# Patient Record
Sex: Male | Born: 1970
Health system: Southern US, Community
[De-identification: ages and names within clinical notes are randomized; demographics above are authoritative.]

## PROBLEM LIST (undated history)

## (undated) DIAGNOSIS — R0789 Other chest pain: Secondary | ICD-10-CM

## (undated) DIAGNOSIS — K219 Gastro-esophageal reflux disease without esophagitis: Secondary | ICD-10-CM

## (undated) DIAGNOSIS — I452 Bifascicular block: Secondary | ICD-10-CM

## (undated) DIAGNOSIS — K4190 Unilateral femoral hernia, without obstruction or gangrene, not specified as recurrent: Secondary | ICD-10-CM

## (undated) HISTORY — DX: Unilateral femoral hernia, without obstruction or gangrene, not specified as recurrent: K41.90

## (undated) HISTORY — DX: Bifascicular block: I45.2

## (undated) HISTORY — DX: Gastro-esophageal reflux disease without esophagitis: K21.9

## (undated) HISTORY — PX: OTHER SURGICAL HISTORY: SHX169

## (undated) HISTORY — DX: Other chest pain: R07.89

---

## 2008-02-03 ENCOUNTER — Ambulatory Visit (HOSPITAL_COMMUNITY): Admission: RE | Admit: 2008-02-03 | Discharge: 2008-02-03 | Payer: Self-pay | Admitting: Chiropractic Medicine

## 2008-12-26 ENCOUNTER — Emergency Department (HOSPITAL_COMMUNITY): Admission: EM | Admit: 2008-12-26 | Discharge: 2008-12-26 | Payer: Self-pay | Admitting: Emergency Medicine

## 2009-06-17 ENCOUNTER — Ambulatory Visit: Payer: Self-pay | Admitting: Family Medicine

## 2009-06-17 LAB — CONVERTED CEMR LAB
Hemoglobin: 13.2 g/dL
Microalbumin U total vol: 80 mg/L

## 2011-02-06 ENCOUNTER — Ambulatory Visit (INDEPENDENT_AMBULATORY_CARE_PROVIDER_SITE_OTHER): Payer: BC Managed Care – PPO | Admitting: Family Medicine

## 2011-02-06 ENCOUNTER — Encounter: Payer: Self-pay | Admitting: Family Medicine

## 2011-02-06 DIAGNOSIS — R0789 Other chest pain: Secondary | ICD-10-CM | POA: Insufficient documentation

## 2011-02-06 DIAGNOSIS — I4460 Unspecified fascicular block: Secondary | ICD-10-CM | POA: Insufficient documentation

## 2011-02-14 NOTE — Assessment & Plan Note (Signed)
Summary: Atypical CP   Vital Signs:  Patient profile:   40 year old male Height:      71.4 inches Weight:      194.25 pounds BMI:     26.89 O2 Sat:      100 % on Room air Pulse rate:   80 / minute BP sitting:   124 / 77  (right arm) Cuff size:   large  Vitals Entered By: Francee Piccolo CMA Duncan Dull) (February 06, 2011 10:44 AM)  O2 Flow:  Room air CC: globus sensation when lying down, thought he had heartburn, took Prevacid with some relief...SP Is Patient Diabetic? No   Primary Care Provider:  Nani Gasser MD  CC:  globus sensation when lying down, thought he had heartburn, and took Prevacid with some relief...SP.  History of Present Illness: globus sensation when lying down, thought he had heartburn, took Prevacid with some relief...SP.  Says it is not a pain but feels like when he lays down feels like tsomthing wants to come up in his throat. Feels annoying.  No sharp pain. No blood in the bowels or urine.  Does have a cold right now but says never had this with a cold before.  Better when stands up.   Took Prevacid for 2 days.  Does have a nighttime snack before dinner.  No brash.  No nausea or vomiting. NO SOB.    Last time I saw him was in 2010 and he never went for labs.    Current Medications (verified): 1)  Prevacid 15 Mg Cpdr (Lansoprazole) .... Take 1 Tablet By Mouth Once A Day  Allergies (verified): No Known Drug Allergies  Physical Exam  General:  Well-developed,well-nourished,in no acute distress; alert,appropriate and cooperative throughout examination Head:  Normocephalic and atraumatic without obvious abnormalities. No apparent alopecia or balding. Neck:  No deformities, masses, or tenderness noted. Lungs:  Normal respiratory effort, chest expands symmetrically. Lungs are clear to auscultation, no crackles or wheezes. Heart:  Normal rate and regular rhythm. S1 and S2 normal without gallop, murmur, click, rub or other extra sounds. Abdomen:  Bowel  sounds positive,abdomen soft and non-tender without masses, organomegaly or hernias noted. Skin:  no rashes.   Cervical Nodes:  No lymphadenopathy noted Psych:  Cognition and judgment appear intact. Alert and cooperative with normal attention span and concentration. No apparent delusions, illusions, hallucinations   Impression & Recommendations:  Problem # 1:  CHEST PAIN, ATYPICAL (ICD-786.59)  Likely GERD based on his sxs.  Not likely cardiac but did get EKG today EKG shows rate of 65 bpm, no acute changes but does have a incomplete RBBB. Likely normal variant since he is young. Will refer to Cardiology for further evaluation since he is very concerned about this.   Continue prevacid. If not resolving in 2 weeks then call the office. Also reviewd reflux hygient and recommend not eating 3 hours prior to bedtime.  Over due for screening labs. Never went when had CPE in 2010.  Orders: T-Comprehensive Metabolic Panel [phone]) T-Lipid Profile [phone]) T-TSH [phone]) EKG w/ Interpretation (93000)  Complete Medication List: 1)  Prevacid 15 Mg Cpdr (Lansoprazole) .... Take 1 tablet by mouth once a day  Other Orders: Cardiology Referral (Cardiology)  Patient Instructions: 1)  Continue your prevacid. About 20 min before dinner.  If not better in 1-2 weeks then please call the oiffice. It if it helping then continue for one month and then taper to every other day. 2)  Avoid eating 3  hours before bedtime if can. 3)  Avoid greasy, spicey, acidic foods. Avoid carbonated beverages.  4)  Can raise the head of the bed if needed.    Orders Added: 1)  Est. Patient Level IV [16109] 2)  T-Comprehensive Metabolic Panel [[id number]] 3)  T-Lipid Profile [[id number]] 4)  T-TSH [[id number]] 5)  Cardiology Referral [Cardiology] 6)  EKG w/ Interpretation [93000]

## 2011-02-21 ENCOUNTER — Encounter: Payer: Self-pay | Admitting: Cardiology

## 2011-02-22 ENCOUNTER — Ambulatory Visit: Payer: BC Managed Care – PPO | Admitting: Cardiology

## 2011-02-22 ENCOUNTER — Ambulatory Visit (INDEPENDENT_AMBULATORY_CARE_PROVIDER_SITE_OTHER): Payer: BC Managed Care – PPO | Admitting: Cardiology

## 2011-02-22 ENCOUNTER — Encounter: Payer: Self-pay | Admitting: Cardiology

## 2011-02-22 DIAGNOSIS — K219 Gastro-esophageal reflux disease without esophagitis: Secondary | ICD-10-CM

## 2011-02-22 DIAGNOSIS — R0789 Other chest pain: Secondary | ICD-10-CM

## 2011-02-22 NOTE — Assessment & Plan Note (Signed)
Continue present medications. Management per primary care. 

## 2011-02-22 NOTE — Assessment & Plan Note (Signed)
Symptoms are most consistent with gastroesophageal reflux disease. He does not have cardiac risk factors and his electrocardiogram showed no ST changes. He does not have exertional chest pain. I do not think further cardiac workup is indicated.

## 2011-02-22 NOTE — Progress Notes (Signed)
HPI: 40 year old male for evaluation of chest pain. No prior cardiac history He exercises routinely and denies dyspnea on exertion, orthopnea, PND, pedal edema, palpitations, syncope or exertional chest pain. Several weeks ago he began having an uncomfortable feeling in the substernal area. It occurred after eating and lying flat. It improved with sitting up. There were no associated symptoms. He was started on Prevacid and his symptoms improved. He occasionally feels a "pinch" in his chest for one to 2 seconds. Because of the above we were asked to further evaluate.  Current Outpatient Prescriptions  Medication Sig Dispense Refill  . lansoprazole (PREVACID) 15 MG capsule Take 15 mg by mouth as needed.       . Omega-3 Fatty Acids (FISH OIL) 1000 MG CAPS Take 1 capsule by mouth daily.          Not on File  Past Medical History  Diagnosis Date  . GERD (gastroesophageal reflux disease)     Past Surgical History  Procedure Date  . None     History   Social History  . Marital Status: Single    Spouse Name: N/A    Number of Children: N/A  . Years of Education: N/A   Occupational History  . Realtor    Social History Main Topics  . Smoking status: Never Smoker   . Smokeless tobacco: Not on file  . Alcohol Use: 0.0 oz/week     occasiona  . Drug Use: No  . Sexually Active: Not on file   Other Topics Concern  . Not on file   Social History Narrative  . No narrative on file    Family History  Problem Relation Age of Onset  . Coronary artery disease      No family history of premature CAD    ROS: no fevers or chills, productive cough, hemoptysis, dysphasia, odynophagia, melena, hematochezia, dysuria, hematuria, rash, seizure activity, orthopnea, PND, pedal edema, claudication. Remaining systems are negative.  Physical Exam: General:  Well developed/well nourished in NAD Skin warm/dry Patient not depressed No peripheral clubbing Back-normal HEENT-normal/normal  eyelids Neck supple/normal carotid upstroke bilaterally; no bruits; no JVD; no thyromegaly chest - CTA/ normal expansion CV - RRR/normal S1 and S2; no murmurs, rubs or gallops;  PMI nondisplaced Abdomen -NT/ND, no HSM, no mass, + bowel sounds, no bruit 2+ femoral pulses, no bruits Ext-no edema, chords, 2+ DP Neuro-grossly nonfocal  ECG - 02/06/11 - Normal sinus rhythm, RV conduction delay, no ST changes.

## 2011-03-01 ENCOUNTER — Encounter: Payer: Self-pay | Admitting: Cardiology

## 2011-03-15 ENCOUNTER — Ambulatory Visit: Payer: BC Managed Care – PPO | Admitting: Cardiology

## 2011-03-20 LAB — URINALYSIS, ROUTINE W REFLEX MICROSCOPIC
Bilirubin Urine: NEGATIVE
Hgb urine dipstick: NEGATIVE
Ketones, ur: 15 mg/dL — AB
Nitrite: NEGATIVE
Urobilinogen, UA: 1 mg/dL (ref 0.0–1.0)
pH: 7.5 (ref 5.0–8.0)

## 2012-04-25 ENCOUNTER — Emergency Department
Admit: 2012-04-25 | Discharge: 2012-04-25 | Disposition: A | Discharge status: Home / Self Care | Payer: BC Managed Care – PPO | Attending: Emergency Medicine | Admitting: Emergency Medicine

## 2012-04-25 ENCOUNTER — Encounter: Payer: Self-pay | Admitting: Emergency Medicine

## 2012-04-25 ENCOUNTER — Ambulatory Visit: Payer: BC Managed Care – PPO | Admitting: Family Medicine

## 2012-04-25 ENCOUNTER — Emergency Department
Admission: EM | Admit: 2012-04-25 | Discharge: 2012-04-25 | Disposition: A | Discharge status: Home / Self Care | Payer: BC Managed Care – PPO | Source: Home / Self Care | Attending: Emergency Medicine | Admitting: Emergency Medicine

## 2012-04-25 DIAGNOSIS — M25569 Pain in unspecified knee: Secondary | ICD-10-CM

## 2012-04-25 MED ORDER — MELOXICAM 7.5 MG PO TABS: 7.5000 mg | ORAL_TABLET | Freq: Two times a day (BID) | ORAL | Status: DC | PRN

## 2012-04-25 NOTE — ED Notes (Signed)
Rt knee pain x 3 days while running up steps

## 2012-04-25 NOTE — ED Provider Notes (Signed)
History     CSN: [id number]  Arrival date & time 04/25/12  1349   First MD Initiated Contact with Patient 04/25/12 1350      Chief Complaint  Patient presents with  . Knee Pain    (Consider location/radiation/quality/duration/timing/severity/associated sxs/prior treatment) HPI  This is a 41 year old [place] American male who presents today with right knee pain for the last 3 days.  He states that he was running up steps at his house and felt a twinge on the lateral aspect of his right knee.  He states that he has an intermittent pain that comes and goes.  It lasts for seconds and then goes away completely.  It is always located on the outside of his right knee.  No swelling, bruising.  He has been walking normally.  No other injuries.  No chest pain or shortness of breath.  He has not been using any medications or modalities.  He has never injured this knee in the past.  Past Medical History  Diagnosis Date  . GERD (gastroesophageal reflux disease)   . Femoral hernia   . RBBB (right bundle branch block with left anterior fascicular block)     hemblock  . Chest pain, atypical     Past Surgical History  Procedure Date  . None     Family History  Problem Relation Age of Onset  . Coronary artery disease      No family history of premature CAD    History  Substance Use Topics  . Smoking status: Never Smoker   . Smokeless tobacco: Not on file  . Alcohol Use: No     occasiona      Review of Systems  All other systems reviewed and are negative.    Allergies  Review of patient's allergies indicates no known allergies.  Home Medications   Current Outpatient Rx  Name Route Sig Dispense Refill  . LANSOPRAZOLE 15 MG PO CPDR Oral Take 15 mg by mouth as needed.     . MELOXICAM 7.5 MG PO TABS Oral Take 1 tablet (7.5 mg total) by mouth 2 (two) times daily as needed for pain. 30 tablet 0  . FISH OIL 1000 MG PO CAPS Oral Take 1 capsule by mouth daily.        BP 113/76   Pulse 89  Temp(Src) 98.5 F (36.9 C) (Oral)  Resp 12  Ht 5\' 11"  (1.803 m)  Wt 192 lb (87.091 kg)  BMI 26.78 kg/m2  SpO2 98%  Physical Exam  Nursing note and vitals reviewed. Constitutional: He is oriented to person, place, and time. He appears well-developed and well-nourished.  HENT:  Head: Normocephalic and atraumatic.  Eyes: No scleral icterus.  Neck: Neck supple.  Cardiovascular: Regular rhythm and normal heart sounds.   Pulmonary/Chest: Effort normal and breath sounds normal. No respiratory distress.  Musculoskeletal:       R knee: Full range of motion, no effusion, no ecchymoses, Lachmans normal, Anterior & posterior drawer normal, McMurrays normal, Varus & valgus stress normal.  Patella freely mobile, Clarks compression test normal.  Good alignment. Distal neurovascular status is intact.  I cannot elicit any tenderness to palpation  Neurological: He is alert and oriented to person, place, and time.  Skin: Skin is warm and dry.  Psychiatric: He has a normal mood and affect. His speech is normal.    ED Course  Procedures (including critical care time)  Labs Reviewed - No data to display Dg Knee Ap/lat W/sunrise Right  04/25/2012  *RADIOLOGY REPORT*  Clinical Data: Injury, pain.  DG KNEE - 3 VIEWS  Comparison: None.  Findings: Imaged bones, joints and soft tissues appear normal.  IMPRESSION: Negative exam.  Original Report Authenticated By: Bernadene Bell. D'ALESSIO, M.D.     1. Pain, joint, knee       MDM   An x-ray was ordered and read by the radiologist as above.  Likely mild lateral knee strain, most likely the biceps femoris versus some IT band irritation. Encourage rest, ice, compression with ACE bandage and/or a brace, and elevation of injured body part.  The role of anti-inflammatories is discussed with the patient.  I gave him a prescription for meloxicam.  He can buy over-the-counter knee sleeve if he wishes.  Would avoid lots of stairs, kneeling and squatting, or  running for the next week.  It is not improving in about 4-5 days, he will call us back and we will get him in to see a sports medicine (Dr. Pearletha Forge).  Marlaine Hind, MD 04/25/12 [phone]

## 2012-11-22 ENCOUNTER — Encounter: Payer: Self-pay | Admitting: Sports Medicine

## 2012-11-22 ENCOUNTER — Ambulatory Visit (INDEPENDENT_AMBULATORY_CARE_PROVIDER_SITE_OTHER): Payer: BC Managed Care – PPO | Admitting: Sports Medicine

## 2012-11-22 ENCOUNTER — Ambulatory Visit (INDEPENDENT_AMBULATORY_CARE_PROVIDER_SITE_OTHER): Payer: BC Managed Care – PPO

## 2012-11-22 VITALS — BP 110/74 | HR 82 | Wt 192.0 lb

## 2012-11-22 DIAGNOSIS — M545 Low back pain, unspecified: Secondary | ICD-10-CM | POA: Insufficient documentation

## 2012-11-22 DIAGNOSIS — M549 Dorsalgia, unspecified: Secondary | ICD-10-CM

## 2012-11-22 DIAGNOSIS — Z3009 Encounter for other general counseling and advice on contraception: Secondary | ICD-10-CM | POA: Insufficient documentation

## 2012-11-22 MED ORDER — MELOXICAM 15 MG PO TABS: ORAL_TABLET | ORAL | Status: DC

## 2012-11-22 NOTE — Progress Notes (Addendum)
SPORTS MEDICINE CONSULTATION REPORT  Subjective:    CC: Back pain  HPI: Low back pain: Present for approximately 4 weeks, denies any trauma, or change in physical activity. Pain is localized in the mid lumbar spine, in the midline, without radiation. It's not worse with flexion, extension, or Valsalva, but it is somewhat worse associated with stiffness after long car rides. He denies any radicular symptoms, loss of bowel or bladder function, or saddle anesthesia. He denies any constitutional symptoms. He has not yet used any medications for this. Symptoms are moderate.  Family planning: Is wondering if there is anything wrong with his fertility. He wonders why his wife has not gotten pregnant, however they have not used the rhythm method due to not being aware of this, and he is not been trying for one year. We had a long discussion about possible causes, as well as physiologic timing of ovulation, and when to perform intercourse.  Past medical history, Surgical history, Family history, Social history, Allergies, and medications have been entered into the medical record, reviewed, and no changes needed.   Review of Systems: No headache, visual changes, nausea, vomiting, diarrhea, constipation, dizziness, abdominal pain, skin rash, fevers, chills, night sweats, weight loss, swollen lymph nodes, body aches, joint swelling, muscle aches, chest pain, shortness of breath, mood changes, visual or auditory hallucinations.   Objective:   Vitals:  Afebrile, vital signs stable. General: Well Developed, well nourished, and in no acute distress.  Neuro/Psych: Alert and oriented x3, extra-ocular muscles intact, able to move all 4 extremities.  Skin: Warm and dry, no rashes noted.  Respiratory: Not using accessory muscles, speaking in full sentences, trachea midline.  Cardiovascular: Pulses palpable, no extremity edema. Abdomen: Does not appear distended. Back Exam:  Inspection: Unremarkable  Motion:  Flexion 45 deg, Extension 45 deg, Side Bending to 45 deg bilaterally,  Rotation to 45 deg bilaterally  SLR laying: Negative  XSLR laying: Negative  Palpable tenderness: None. FABER: negative. Sensory change: Gross sensation intact to all lumbar and sacral dermatomes.  Reflexes: 2+ at both patellar tendons, 2+ at achilles tendons, Babinski's downgoing.  Strength at foot  Plantar-flexion: 5/5 Dorsi-flexion: 5/5 Eversion: 5/5 Inversion: 5/5  Leg strength  Quad: 5/5 Hamstring: 5/5 Hip flexor: 5/5 Hip abductors: 5/5  Gait unremarkable.  X-rays were reviewed they show a loss of disc height at the L4-5 level, as well as the L5-S1 level.  Impression and Recommendations:   This case required medical decision making of moderate complexity.  I spent greater than 40 minutes with this patient, greater than 50% was face-to-face time counseling the above 2 problems.

## 2012-11-22 NOTE — Assessment & Plan Note (Signed)
Axial, and likely discogenic. We will start conservatively with an x-ray, Mobic, and formal physical therapy. He would prefer to do this in [place]

## 2012-11-22 NOTE — Addendum Note (Signed)
Addended by: Monica Becton on: 11/22/2012 05:51 PM   Modules accepted: Level of Service

## 2012-11-22 NOTE — Assessment & Plan Note (Signed)
We had a long discussion about using the rhythm method, the physiologic timing of ovulation, and the process for determining infertility. He understands when she is fertile, and understands that he needs to try this for one year.  It does seem as though he's interested in semen analysis, and I advised him to discuss this with the urologist of choice, and I would be happy to provide a referral if needed. He will think about this.

## 2012-12-12 ENCOUNTER — Ambulatory Visit: Payer: BC Managed Care – PPO | Attending: Sports Medicine | Admitting: Rehabilitation

## 2012-12-12 ENCOUNTER — Ambulatory Visit: Payer: BC Managed Care – PPO | Admitting: Physical Therapy

## 2012-12-12 DIAGNOSIS — M545 Low back pain, unspecified: Secondary | ICD-10-CM | POA: Insufficient documentation

## 2012-12-12 DIAGNOSIS — IMO0001 Reserved for inherently not codable concepts without codable children: Secondary | ICD-10-CM | POA: Insufficient documentation

## 2012-12-19 ENCOUNTER — Ambulatory Visit: Payer: BC Managed Care – PPO | Admitting: Rehabilitation

## 2012-12-31 ENCOUNTER — Ambulatory Visit: Payer: BC Managed Care – PPO | Admitting: Rehabilitation

## 2013-02-04 ENCOUNTER — Encounter: Payer: Self-pay | Admitting: Family Medicine

## 2013-02-04 ENCOUNTER — Ambulatory Visit (INDEPENDENT_AMBULATORY_CARE_PROVIDER_SITE_OTHER): Payer: BC Managed Care – PPO | Admitting: Family Medicine

## 2013-02-04 VITALS — BP 130/75 | HR 85 | Ht 71.5 in | Wt 195.0 lb

## 2013-02-04 DIAGNOSIS — R109 Unspecified abdominal pain: Secondary | ICD-10-CM

## 2013-02-04 DIAGNOSIS — R102 Pelvic and perineal pain: Secondary | ICD-10-CM

## 2013-02-04 LAB — POCT URINALYSIS DIPSTICK
Bilirubin, UA: NEGATIVE
Glucose, UA: NEGATIVE
Ketones, UA: NEGATIVE
Leukocytes, UA: NEGATIVE
Spec Grav, UA: >=1.03

## 2013-02-04 NOTE — Progress Notes (Signed)
  Subjective:    Patient ID: Brett Hall, male    DOB: 1971-09-03, 42 y.o.   MRN: [id number]  HPI Has been having pelvic pain that started about a year ago and is worried about his prostate. He says initially it was very infrequent but feels like it's happening more often now. He says it's not really a true pain but says it just feels a little uncomfortable. He denies any throbbing or sharp pains. No constipation. No hematuria.  No major issues with ED.  No family hx of prostate Cancer. He and his girlfriend haven't been able to get pregnant, and have been trying for well over a year.  No pain in the groin creases. It's not worse when he strains or lifts heavy objects. Pain around anus area like it is inside. No blood in the stool.  No constipation. Good energy and appetite. Girlfriend had a UTI recently. No penile discharge. He does note occasionally finds it difficult to postpone urination. But no urgency. He also has noticed a couple times a little bit weaker stream than usual. No fever. No other systemic symptoms. No nausea or vomiting or abdominal pain. He does feel like the pain occasionally radiates into his back that he's also had some recent low back problems and so my partner for low back pain. He feels these are little different.   Review of Systems     Objective:   Physical Exam  Constitutional: He is oriented to person, place, and time. He appears well-developed and well-nourished.  Genitourinary: Rectum normal. Guaiac negative stool.  Prostate is borderline enlarged. No nodule. Nontender. No external hemorrhoids.   Neurological: He is alert and oriented to person, place, and time.  Skin: Skin is warm and dry.  Psychiatric: He has a normal mood and affect. His behavior is normal.          Assessment & Plan:  Pelvic pain-unclear etiology at this point. Consider prostatitis versus UTI versus STD. We will start with a prostate exam to rule out any enlargement or nodules. Will  check PSA. We'll also perform urinalysis to evaluate for UTI since he has noticed some difficulty holding his urine occasionally. Also  checking for STDs, diarrhea and Chlamydia. AUA score of 5 today.    Possible infertility-He has not really had a workup yet. But certainly we could consider getting a sperm count for further evaluation if he would like. The think overall this is probably a separate issue.

## 2013-02-05 NOTE — Addendum Note (Signed)
Addended by: Deno Etienne on: 02/05/2013 09:52 AM   Modules accepted: Orders

## 2013-02-06 LAB — CBC WITH DIFFERENTIAL/PLATELET
Eosinophils Absolute: 0.1 10*3/uL (ref 0.0–0.7)
Eosinophils Relative: 2 % (ref 0–5)
HCT: 42.2 % (ref 39.0–52.0)
Lymphs Abs: 1.7 10*3/uL (ref 0.7–4.0)
MCH: 29.3 pg (ref 26.0–34.0)
MCV: 86.5 fL (ref 78.0–100.0)
Monocytes Absolute: 0.4 10*3/uL (ref 0.1–1.0)
Platelets: 197 10*3/uL (ref 150–400)
RBC: 4.88 MIL/uL (ref 4.22–5.81)

## 2013-02-10 ENCOUNTER — Other Ambulatory Visit: Payer: Self-pay | Admitting: Family Medicine

## 2013-02-10 MED ORDER — SULFAMETHOXAZOLE-TRIMETHOPRIM 800-160 MG PO TABS: 1.0000 | ORAL_TABLET | Freq: Two times a day (BID) | ORAL | Status: DC

## 2013-11-11 ENCOUNTER — Encounter: Payer: Self-pay | Admitting: Family Medicine

## 2013-11-11 ENCOUNTER — Telehealth: Payer: Self-pay | Admitting: *Deleted

## 2013-11-11 ENCOUNTER — Ambulatory Visit (INDEPENDENT_AMBULATORY_CARE_PROVIDER_SITE_OTHER): Payer: BC Managed Care – PPO | Admitting: Family Medicine

## 2013-11-11 ENCOUNTER — Other Ambulatory Visit: Payer: Self-pay | Admitting: Family Medicine

## 2013-11-11 VITALS — BP 114/78 | HR 78 | Temp 97.4°F | Ht 71.6 in | Wt 192.0 lb

## 2013-11-11 DIAGNOSIS — R102 Pelvic and perineal pain: Secondary | ICD-10-CM

## 2013-11-11 DIAGNOSIS — IMO0002 Reserved for concepts with insufficient information to code with codable children: Secondary | ICD-10-CM

## 2013-11-11 DIAGNOSIS — L739 Follicular disorder, unspecified: Secondary | ICD-10-CM

## 2013-11-11 DIAGNOSIS — R109 Unspecified abdominal pain: Secondary | ICD-10-CM

## 2013-11-11 DIAGNOSIS — N3289 Other specified disorders of bladder: Secondary | ICD-10-CM

## 2013-11-11 DIAGNOSIS — N369 Urethral disorder, unspecified: Secondary | ICD-10-CM

## 2013-11-11 DIAGNOSIS — L738 Other specified follicular disorders: Secondary | ICD-10-CM

## 2013-11-11 DIAGNOSIS — I861 Scrotal varices: Secondary | ICD-10-CM

## 2013-11-11 MED ORDER — SULFAMETHOXAZOLE-TRIMETHOPRIM 800-160 MG PO TABS: 1.0000 | ORAL_TABLET | Freq: Two times a day (BID) | ORAL | Status: DC

## 2013-11-11 NOTE — Progress Notes (Signed)
Subjective:    Patient ID: Brett Hall, male    DOB: 03/19/1971, 42 y.o.   MRN: [id number]  HPI Last seen in March for pelvic pain:  "Has been having pelvic pain that started about a year ago and is worried about his prostate. He says initially it was very infrequent but feels like it's happening more often now. He says it's not really a true pain but says it just feels a little uncomfortable. He denies any throbbing or sharp pains. No constipation. No hematuria. No major issues with ED. No family hx of prostate Cancer. He and his girlfriend haven't been able to get pregnant, and have been trying for well over a year. No pain in the groin creases. It's not worse when he strains or lifts heavy objects. Pain around anus area like it is inside. No blood in the stool. No constipation. Good energy and appetite. Girlfriend had a UTI recently. No penile discharge. He does note occasionally finds it difficult to postpone urination. But no urgency. He also has noticed a couple times a little bit weaker stream than usual. No fever. No other systemic symptoms. No nausea or vomiting or abdominal pain. He does feel like the pain occasionally radiates into his back that he's also had some recent low back problems and so my partner for low back pain. He feels these are little different."  Says still no weight changes, fever chills, or night sweats.  No lumps or swollen glands.  Pain is in bilate groin and can radiates to buttock.  He does occasionally feel like he has to have a bowel movement but then doesn't. He's not sure if this is related to bowel or not.  He did see a urologist [facility] for infertility. He did do a semen analysis. Evidently he had a normal count but they were slow swimmer's. After evaluation they felt that he had a varicocele that could be responsible for this and offered to do surgical correction. He's thinking about having it done.  He's had some bumps at the base of his neck. He  talked to his barber about it. His barber did use [employer] but says they were cleaned well. He notices that the bumps have gotten worse since the weather has gotten cooler.. Has not tried any topical treatment except for some baking soda to try to dry out. No fever or drainage from the lumps.  Review of Systems  BP 114/78  Pulse 78  Temp(Src) 97.4 F (36.3 C)  Ht 5' 11.6" (1.819 m)  Wt 192 lb (87.091 kg)  BMI 26.32 kg/m2    No Known Allergies  Past Medical History  Diagnosis Date  . GERD (gastroesophageal reflux disease)   . Femoral hernia   . RBBB (right bundle branch block with left anterior fascicular block)     hemblock  . Chest pain, atypical     Past Surgical History  Procedure Laterality Date  . None      History   Social History  . Marital Status: Single    Spouse Name: N/A    Number of Children: N/A  . Years of Education: N/A   Occupational History  . Realtor    Social History Main Topics  . Smoking status: Never Smoker   . Smokeless tobacco: Not on file  . Alcohol Use: No     Comment: occasiona  . Drug Use: No  . Sexual Activity: Not on file   Other Topics Concern  . Not on  file   Social History Narrative  . No narrative on file    Family History  Problem Relation Age of Onset  . Coronary artery disease      No family history of premature CAD    Outpatient Encounter Prescriptions as of 11/11/2013  Medication Sig  . Flaxseed MISC by Does not apply route.  . multivitamin (ONE-A-DAY MEN'S) TABS tablet Take 1 tablet by mouth daily.  . Omega-3 Fatty Acids (FISH OIL) 1000 MG CAPS Take 1 capsule by mouth daily.    Marland Kitchen sulfamethoxazole-trimethoprim (BACTRIM DS,SEPTRA DS) 800-160 MG per tablet Take 1 tablet by mouth 2 (two) times daily.  . [DISCONTINUED] sulfamethoxazole-trimethoprim (BACTRIM DS,SEPTRA DS) 800-160 MG per tablet Take 1 tablet by mouth 2 (two) times daily.          Objective:   Physical Exam  Constitutional: He is oriented to  person, place, and time. He appears well-developed and well-nourished.  HENT:  Head: Normocephalic and atraumatic.  Cardiovascular: Normal rate, regular rhythm and normal heart sounds.   Pulmonary/Chest: Effort normal and breath sounds normal.  Abdominal: Soft. Bowel sounds are normal. He exhibits no distension and no mass. There is no tenderness. There is no rebound and no guarding.  Neurological: He is alert and oriented to person, place, and time.  Skin: Skin is warm and dry.  Folliculitis at the base of the hairline on his neck.  Psychiatric: He has a normal mood and affect. His behavior is normal.          Assessment & Plan:  Pelvic pain-still unclear etiology. Really does not sound GI. He did do well man about staying in the spring so we'll repeat the course and see if it's helpful. He's very worried about cancer. We'll go ahead and move forward with a pelvic CT just to make sure that everything looks okay. If it's normal then I recommend getting him back in with urology at [facility] for further evaluation and treatment options.  Folliculitis- I'm going to put him on Bactrim for possible prostatitis which helped a folliculitis as well. If he continues to recur then consider topical treatment as an option. Explained to him is typically triggered by irritation of the hair follicles which can become inflamed and sometimes infected.

## 2013-11-11 NOTE — Patient Instructions (Signed)
Prostatitis The prostate gland is about the size and shape of a walnut. It is located just below your bladder. It produces one of the components of semen, which is made up of sperm and the fluids that help nourish and transport it out from the testicles. Prostatitis is redness, soreness, and swelling (inflammation) of the prostate gland.  There are 3 types of prostatitis:  Acute bacterial prostatitis This is the least common type of prostatitis. It starts quickly and usually leads to a bladder infection. It can occur at any age.  Chronic bacterial prostatitis This is a persistent bacterial infection in the prostate. It usually develops from repeated acute bacterial prostatitis or acute bacterial prostatitis that was not properly treated. It can occur in men of any age but is most common in middle-aged men whose prostate has begun to enlarge.   Chronic prostatitis chronic pelvic pain syndrome This is the most common type of prostatitis. It is inflammation of the prostate gland that is not caused by a bacterial infection. The cause is unknown. CAUSES The cause of acute and chronic bacterial prostatitis is a bacterial infection. The exact cause of chronic prostatitis and chronic pelvic pain syndrome and asymptomatic inflammatory prostatitis is unknown.  SYMPTOMS  Symptoms can vary depending upon the type of prostatitis that exists. There can also be overlap in symptoms. Possible symptoms for each type of prostatitis are listed below. Acute bacterial prostatitis  Painful urination.  Fever or chills.  Muscle or joint pains.  Low back pain.  Low abdominal pain.  Inability to empty bladder completely.  Sudden urge to urinate.  Frequent urination.  Difficulty starting urine stream.  Weak urine stream.  Discharge from the urethra.  Dribbling after urination.  Rectal pain.  Pain in the testicles, penis, or tip of the penis.  Pain in the space between the anus and scrotum  (perineum).  Problems with sexual function.  Painful ejaculation.  Bloody semen. Chronic bacterial prostatitis  The symptoms are similar to those of acute bacterial prostatitis, but they usually are much less severe. Fever, chills, and muscle and joint pain are not associated with chronic bacterial prostatitis. Chronic prostatitis chronic pelvic pain syndrome  Symptoms typically include a dull ache in the scrotum and the perineum. DIAGNOSIS  In order to diagnose prostatitis, your caregiver will ask about your symptoms. If acute or chronic bacterial prostatitis is suspected, a urine sample will be taken and tested (urinalysis). This is to see if there is bacteria in your urine. If the urinalysis result is negative for bacteria, your caregiver may use a finger to feel your prostate (digital rectal exam). This exam helps your caregiver determine if your prostate is swollen and tender. TREATMENT  Treatment for prostatitis depends on the cause. If a bacterial infection is the cause, it can be treated with antibiotic medicine. In cases of chronic bacterial prostatitis, the use of antibiotics for up to 1 month may be necessary. Your caregiver may instruct you to take sitz baths to help relieve pain. A sitz bath is a bath of hot water in which your hips and buttocks are under water. HOME CARE INSTRUCTIONS   Take all medicines as directed by your caregiver.  Take sitz baths as directed by your caregiver. SEEK MEDICAL CARE IF:   Your symptoms get worse, not better.  You have a fever. SEEK IMMEDIATE MEDICAL CARE IF:   You have chills.  You feel nauseous or vomit.  You feel lightheaded or faint.  You are unable to  urinate.  You have blood or blood clots in your urine. Document Released: 11/17/2000 Document Revised: 02/12/2012 Document Reviewed: 06/09/2013 [facility] Patient Information 2014 [place], [place]. Folliculitis  Folliculitis is redness, soreness, and swelling (inflammation) of the  hair follicles. This condition can occur anywhere on the body. People with weakened immune systems, diabetes, or obesity have a greater risk of getting folliculitis. CAUSES  Bacterial infection. This is the most common cause.  Fungal infection.  Viral infection.  Contact with certain chemicals, especially oils and tars. Long-term folliculitis can result from bacteria that live in the nostrils. The bacteria may trigger multiple outbreaks of folliculitis over time. SYMPTOMS Folliculitis most commonly occurs on the scalp, thighs, legs, back, buttocks, and areas where hair is shaved frequently. An early sign of folliculitis is a small, white or yellow, pus-filled, itchy lesion (pustule). These lesions appear on a red, inflamed follicle. They are usually less than 0.2 inches (5 mm) wide. When there is an infection of the follicle that goes deeper, it becomes a boil or furuncle. A group of closely packed boils creates a larger lesion (carbuncle). Carbuncles tend to occur in hairy, sweaty areas of the body. DIAGNOSIS  Your caregiver can usually tell what is wrong by doing a physical exam. A sample may be taken from one of the lesions and tested in a lab. This can help determine what is causing your folliculitis. TREATMENT  Treatment may include:  Applying warm compresses to the affected areas.  Taking antibiotic medicines orally or applying them to the skin.  Draining the lesions if they contain a large amount of pus or fluid.  Laser hair removal for cases of long-lasting folliculitis. This helps to prevent regrowth of the hair. HOME CARE INSTRUCTIONS  Apply warm compresses to the affected areas as directed by your caregiver.  If antibiotics are prescribed, take them as directed. Finish them even if you start to feel better.  You may take over-the-counter medicines to relieve itching.  Do not shave irritated skin.  Follow up with your caregiver as directed. SEEK IMMEDIATE MEDICAL CARE  IF:   You have increasing redness, swelling, or pain in the affected area.  You have a fever. MAKE SURE YOU:  Understand these instructions.  Will watch your condition.  Will get help right away if you are not doing well or get worse. Document Released: 01/29/2002 Document Revised: 05/21/2012 Document Reviewed: 02/20/2012 [facility] Patient Information 2014 [place], [place].

## 2013-11-11 NOTE — Telephone Encounter (Signed)
PA obtained for CT ABD/Pelvis w/o contrast. Auth# [id number]. Good until 12/10/2013.  Meyer Cory, LPN

## 2013-11-18 ENCOUNTER — Ambulatory Visit (INDEPENDENT_AMBULATORY_CARE_PROVIDER_SITE_OTHER): Payer: BC Managed Care – PPO

## 2013-11-18 DIAGNOSIS — M47817 Spondylosis without myelopathy or radiculopathy, lumbosacral region: Secondary | ICD-10-CM

## 2013-11-18 DIAGNOSIS — R102 Pelvic and perineal pain: Secondary | ICD-10-CM

## 2013-11-18 DIAGNOSIS — N369 Urethral disorder, unspecified: Secondary | ICD-10-CM

## 2013-11-18 DIAGNOSIS — N3289 Other specified disorders of bladder: Secondary | ICD-10-CM

## 2013-11-18 MED ORDER — IOHEXOL 300 MG/ML  SOLN: 100.0000 mL | Freq: Once | INTRAMUSCULAR | Status: AC | PRN

## 2014-04-10 ENCOUNTER — Ambulatory Visit (INDEPENDENT_AMBULATORY_CARE_PROVIDER_SITE_OTHER): Payer: BC Managed Care – PPO | Admitting: Family Medicine

## 2014-04-10 ENCOUNTER — Encounter: Payer: Self-pay | Admitting: Family Medicine

## 2014-04-10 VITALS — BP 123/69 | HR 91 | Wt 192.0 lb

## 2014-04-10 DIAGNOSIS — R195 Other fecal abnormalities: Secondary | ICD-10-CM

## 2014-04-10 DIAGNOSIS — N419 Inflammatory disease of prostate, unspecified: Secondary | ICD-10-CM

## 2014-04-10 DIAGNOSIS — N400 Enlarged prostate without lower urinary tract symptoms: Secondary | ICD-10-CM

## 2014-04-10 NOTE — Patient Instructions (Signed)
Prostatitis The prostate gland is about the size and shape of a walnut. It is located just below your bladder. It produces one of the components of semen, which is made up of sperm and the fluids that help nourish and transport it out from the testicles. Prostatitis is inflammation of the prostate gland.  There are four types of prostatitis:  Acute bacterial prostatitis This is the least common type of prostatitis. It starts quickly and usually is associated with a bladder infection, high fever, and shaking chills. It can occur at any age.  Chronic bacterial prostatitis This is a persistent bacterial infection in the prostate. It usually develops from repeated acute bacterial prostatitis or acute bacterial prostatitis that was not properly treated. It can occur in men of any age but is most common in middle-aged men whose prostate has begun to enlarge. The symptoms are not as severe as those in acute bacterial prostatitis. Discomfort in the part of your body that is in front of your rectum and below your scrotum (perineum), lower abdomen, or in the head of your penis (glans) may represent your primary discomfort.  Chronic prostatitis (nonbacterial) This is the most common type of prostatitis. It is inflammation of the prostate gland that is not caused by a bacterial infection. The cause is unknown and may be associated with a viral infection or autoimmune disorder.  Prostatodynia (pelvic floor disorder) This is associated with increased muscular tone in the pelvis surrounding the prostate. CAUSES The causes of bacterial prostatitis are bacterial infection. The causes of the other types of prostatitis are unknown.  SYMPTOMS  Symptoms can vary depending upon the type of prostatitis that exists. There can also be overlap in symptoms. Possible symptoms for each type of prostatitis are listed below. Acute Bacterial Prostatitis  Painful urination.  Fever or chills.  Muscle or joint pains.  Low  back pain.  Low abdominal pain.  Inability to empty bladder completely. Chronic Bacterial Prostatitis, Chronic Nonbacterial Prostatitis, and Prostatodynia  Sudden urge to urinate.  Frequent urination.  Difficulty starting urine stream.  Weak urine stream.  Discharge from the urethra.  Dribbling after urination.  Rectal pain.  Pain in the testicles, penis, or tip of the penis.  Pain in the perineum.  Problems with sexual function.  Painful ejaculation.  Bloody semen. DIAGNOSIS  In order to diagnose prostatitis, your health care provider will ask about your symptoms. One or more urine samples will be taken and tested (urinalysis). If the urinalysis result is negative for bacteria, your health care provider may use a finger to feel your prostate (digital rectal exam). This exam helps your health care provider determine if your prostate is swollen and tender. It will also produce a specimen of semen that can be analyzed. TREATMENT  Treatment for prostatitis depends on the cause. If a bacterial infection is the cause, it can be treated with antibiotic medicine. In cases of chronic bacterial prostatitis, the use of antibiotics for up to 1 month or 6 weeks may be necessary. Your health care provider may instruct you to take sitz baths to help relieve pain. A sitz bath is a bath of hot water in which your hips and buttocks are under water. This relaxes the pelvic floor muscles and often helps to relieve the pressure on your prostate. HOME CARE INSTRUCTIONS   Take all medicines as directed by your health care provider.  Take sitz baths as directed by your health care provider. SEEK MEDICAL CARE IF:   Your symptoms   get worse, not better.  You have a fever. SEEK IMMEDIATE MEDICAL CARE IF:   You have chills.  You feel nauseous or vomit.  You feel lightheaded or faint.  You are unable to urinate.  You have blood or blood clots in your urine. Document Released: 11/17/2000  Document Revised: 09/10/2013 Document Reviewed: 06/09/2013 [employer] Patient Information 2014 [employer].  

## 2014-04-10 NOTE — Progress Notes (Signed)
   Subjective:    Patient ID: Brett Hall, male    DOB: 06-20-1971, 43 y.o.   MRN: [id number]  HPI Pelvic pain on and off.  He says it just feels like a discomfort. He feels like it's in his pelvis and occasionally radiates towards the rectal area. He points to the suprapubic area. Occasionally radiates towards the testicles. He denies any blood in the stool or the urine. No dysuria. He says his bowel movements are very normal. He denies any constipation or hard stools. No fevers chills or sweats. No significant back pain. He was last seen in December for similar symptoms and was treated with a course of Bactrim for acute prostatitis at that time. He was extremely concerned about the potential for cancer so we did order a CT scan just to make sure that everything was okay and it was completely normal.   Review of Systems     Objective:   Physical Exam  Constitutional: He appears well-developed and well-nourished.  HENT:  Head: Normocephalic and atraumatic.  Genitourinary: Prostate is enlarged and tender.  Mildly enlarged prostate with induration on the right side.   Neurological: He is alert.  Skin: Skin is warm.  Psychiatric: His behavior is normal.  Very anxious over this situation          Assessment & Plan:  Prostatis - based on exam and history today I think his diagnosis is consistent with acute prostatitis. He actually is Re: started the Bactrim. He's had 5 doses. Make sure to complete a full month of the antibiotic. And if he doesn't tolerate it well or is unable to finish it then please call the office back. After that we will recheck a PSA and compared to previous year. He is very worried about the potential for cancer. We'll also repeat a urine gonorrhea and Chlamydia tests today. Next  Guaiac positive stool-given stool cards to take home. He denies any gross blood with bowel movements or with wiping. Samples are negative this is reassuring. If it is positive then will  refer to GI for colonoscopy for further evaluation. Reassured him that this could just be an internal hemorrhoid or trauma from the exam today.

## 2014-04-11 LAB — GC/CHLAMYDIA PROBE AMP, URINE
Chlamydia, Swab/Urine, PCR: NEGATIVE
GC Probe Amp, Urine: NEGATIVE

## 2014-04-17 ENCOUNTER — Other Ambulatory Visit: Payer: Self-pay | Admitting: *Deleted

## 2014-04-17 DIAGNOSIS — Z1211 Encounter for screening for malignant neoplasm of colon: Secondary | ICD-10-CM

## 2014-04-17 LAB — POC HEMOCCULT BLD/STL (HOME/3-CARD/SCREEN)
FECAL OCCULT BLD: NEGATIVE
FECAL OCCULT BLD: NEGATIVE
Fecal Occult Blood, POC: NEGATIVE

## 2014-04-19 ENCOUNTER — Telehealth: Payer: Self-pay | Admitting: Family Medicine

## 2014-04-19 NOTE — Telephone Encounter (Signed)
Call pt: stool cards neg for blood

## 2014-04-20 NOTE — Telephone Encounter (Signed)
Patient is aware, see result note.

## 2014-05-03 ENCOUNTER — Other Ambulatory Visit: Payer: Self-pay | Admitting: Family Medicine

## 2014-05-05 NOTE — Telephone Encounter (Signed)
Please call pt and let him know if he is still having prostate symptoms then at this point we need to refer him to a urologist which is a specialist. If he needs a referral then please enter.

## 2014-06-08 ENCOUNTER — Other Ambulatory Visit: Payer: Self-pay | Admitting: *Deleted

## 2014-06-08 DIAGNOSIS — N419 Inflammatory disease of prostate, unspecified: Secondary | ICD-10-CM

## 2014-06-09 LAB — PSA: PSA: 0.8 ng/mL (ref <=4.00)

## 2014-07-14 ENCOUNTER — Ambulatory Visit (INDEPENDENT_AMBULATORY_CARE_PROVIDER_SITE_OTHER): Payer: BC Managed Care – PPO | Admitting: Family Medicine

## 2014-07-14 ENCOUNTER — Encounter: Payer: Self-pay | Admitting: Family Medicine

## 2014-07-14 VITALS — BP 119/76 | HR 82 | Wt 188.0 lb

## 2014-07-14 DIAGNOSIS — K6289 Other specified diseases of anus and rectum: Secondary | ICD-10-CM

## 2014-07-14 DIAGNOSIS — R1032 Left lower quadrant pain: Secondary | ICD-10-CM | POA: Diagnosis not present

## 2014-07-14 NOTE — Progress Notes (Signed)
   Subjective:    Patient ID: Brett Hall, male    DOB: 05-16-1971, 43 y.o.   MRN: [id number]  HPI Still feel that his prostate is still inflammed.  Says will get an urge to have a BM but then won't go.  Denies any constipation. He feels that otherwise his bowels move normally. He has not noticed any blood in his stool. He did have a sharp pain in the left lower cord drink a couple of days ago. He says it was pretty intense but short-lived. He denies any fevers chills or sweats. He does occasionally have discomfort that radiates into the pelvis and rectal area but this is not often. He would like to see a urologist if possible. He does have a known history of a varicocele and wonders if this could be contributing to his pain. He occasionally has some low back pain.   Review of Systems     Objective:   Physical Exam  Constitutional: He appears well-developed and well-nourished.  HENT:  Head: Normocephalic and atraumatic.  Abdominal: Soft. He exhibits no distension and no mass. There is tenderness. There is no rebound and no guarding.  TTP in the LLQ.   Skin: Skin is warm and dry.  Psychiatric: He has a normal mood and affect. His behavior is normal.          Assessment & Plan:  Prostatitis - At this point I would like to refer him to urology for further evaluation. I am not completely convinced that this is his prostate. It could be more GI or rectal related. I reassured him that prostatitis is not related to increased risk of prostate cancer as he was concerned about this. He did buy a supplement for colon health but it's mostly a fiber supplement and wants to know if it's okay to take this. I also reassured him that I do not feel like his discomfort is likely related to his varicocele.  LLQ pain - He did have a little bit of tenderness in the left lower cord her in with palpation which is typically:. We discussed trying to move his bowels more regularly and see if this makes a  difference in his pain or discomfort. .Marland Kitchen

## 2014-08-14 ENCOUNTER — Ambulatory Visit (INDEPENDENT_AMBULATORY_CARE_PROVIDER_SITE_OTHER): Payer: BC Managed Care – PPO | Admitting: Family Medicine

## 2014-08-14 ENCOUNTER — Encounter: Payer: Self-pay | Admitting: Family Medicine

## 2014-08-14 VITALS — BP 125/77 | HR 76 | Temp 97.9°F | Wt 192.0 lb

## 2014-08-14 DIAGNOSIS — T148XXA Other injury of unspecified body region, initial encounter: Secondary | ICD-10-CM

## 2014-08-14 MED ORDER — HM SAW PALMETTO COMPLEX PO CAPS: 1.0000 | ORAL_CAPSULE | Freq: Every day | ORAL | Status: DC

## 2014-08-14 NOTE — Progress Notes (Signed)
CC: Brett Hall is a 43 y.o. male is here for left abdominal pain   Subjective: HPI:  Reports fearfulness that he may have a tumor since he began experiencing left lower quadrant abdominal pain described as only pain and discomfort over the past 2 weeks. Symptoms wax and wane over the course of the day and nothing particularly makes it better or worse. He's never had this before that he remembers. Pain is nonradiating and feels close to the surface of the skin. Pain is not improved worsen or change to any degree prior during or after a bowel movement. Pain is nonradiating. Denies any over exertion or trauma but does workout frequently with weightlifting and core exercises.  No interventions as of yet. He is unable to includes the pain for better or worse. He denies fevers, chills, nausea, vomiting, pelvic pain, back pain, flank pain, diarrhea, constipation, testicular pain, blood in urine or blood in stool. Pain does not awaken him from sleep  He would like to go over many of the supplements he is taking including flaxseed and psyllium powder and whether or not this will benefit his overall prostate health. He denies any urinary urgency, hesitancy, nor dysuria  Review Of Systems Outlined In HPI  Past Medical History  Diagnosis Date  . GERD (gastroesophageal reflux disease)   . Femoral hernia   . RBBB (right bundle branch block with left anterior fascicular block)     hemblock  . Chest pain, atypical     Past Surgical History  Procedure Laterality Date  . None     Family History  Problem Relation Age of Onset  . Coronary artery disease      No family history of premature CAD    History   Social History  . Marital Status: Single    Spouse Name: N/A    Number of Children: N/A  . Years of Education: N/A   Occupational History  . Realtor    Social History Main Topics  . Smoking status: Never Smoker   . Smokeless tobacco: Not on file  . Alcohol Use: No     Comment: occasiona   . Drug Use: No  . Sexual Activity: Not on file   Other Topics Concern  . Not on file   Social History Narrative  . No narrative on file     Objective: BP 125/77  Pulse 76  Temp(Src) 97.9 F (36.6 C)  Wt 192 lb (87.091 kg)  General: Alert and Oriented, No Acute Distress HEENT: Pupils equal, round, reactive to light. Conjunctivae clear.  Moist membranes pharynx unremarkable Lungs: Clear to auscultation bilaterally, no wheezing/ronchi/rales.  Comfortable work of breathing. Good air movement. Cardiac: Regular rate and rhythm. Normal S1/S2.  No murmurs, rubs, nor gallops.   Abdomen: Normal bowel sounds, soft and non tender without palpable masses. Extremities: No peripheral edema.  Strong peripheral pulses.  Mental Status: No depression, anxiety, nor agitation. Skin: Warm and dry.  Assessment & Plan: Brett Hall was seen today for left abdominal pain.  Diagnoses and associated orders for this visit:  Muscle strain  Other Orders - Misc Natural Products (HM SAW PALMETTO COMPLEX) CAPS; Take 1 capsule by mouth daily.    Reassurance was provided that his abdominal discomfort is most likely coming from a muscular strain of the obliques or transversalis muscle. He was provided with a home rehabilitation exercise plan to be done on a daily basis for the next 2-3 weeks. Avoid strenuous workouts I counseled him that I know  of no evidence linking prostate health with flaxseed oil or psyllium powder however also that I know of no harm that these substances pose to his prostate.  The only thing I know of which would be considered to supplement which may help with prostate help would be saw palmetto however only for those with an enlarged prostate and symptomatic from it.  25 minutes spent face-to-face during visit today of which at least 50% was counseling or coordinating care regarding: 1. Muscle strain        Return if symptoms worsen or fail to improve.

## 2014-12-28 ENCOUNTER — Ambulatory Visit: Payer: Self-pay | Admitting: Family Medicine

## 2015-03-23 ENCOUNTER — Ambulatory Visit: Payer: Self-pay | Admitting: Physician Assistant

## 2015-03-24 ENCOUNTER — Ambulatory Visit: Payer: Self-pay | Admitting: Physician Assistant

## 2015-04-09 ENCOUNTER — Encounter: Payer: Self-pay | Admitting: *Deleted

## 2015-04-09 ENCOUNTER — Emergency Department (INDEPENDENT_AMBULATORY_CARE_PROVIDER_SITE_OTHER)
Admission: EM | Admit: 2015-04-09 | Discharge: 2015-04-09 | Disposition: A | Discharge status: Home / Self Care | Payer: BLUE CROSS/BLUE SHIELD | Source: Home / Self Care | Attending: Emergency Medicine | Admitting: Emergency Medicine

## 2015-04-09 DIAGNOSIS — N4281 Prostatodynia syndrome: Secondary | ICD-10-CM

## 2015-04-09 LAB — POCT URINALYSIS DIP (MANUAL ENTRY)
Bilirubin, UA: NEGATIVE
GLUCOSE UA: NEGATIVE
Ketones, POC UA: NEGATIVE
LEUKOCYTES UA: NEGATIVE
Nitrite, UA: NEGATIVE
PROTEIN UA: NEGATIVE
SPEC GRAV UA: 1.02 (ref 1.005–1.03)
UROBILINOGEN UA: 0.2 (ref 0–1)
pH, UA: 6.5 (ref 5–8)

## 2015-04-09 NOTE — ED Notes (Signed)
Brett Hall c/o groin and anal pain intermittent x 5 years. He has seen his PCP for this previously, had a not CT abdomen and pelvis CT, he would like a 2nd opinion. Denies dysuria or difficulty with BMs.

## 2015-04-09 NOTE — ED Provider Notes (Signed)
CSN: [id number]     Arrival date & time 04/09/15  1341 History   First MD Initiated Contact with Patient 04/09/15 1351    Today is Friday, he was unable to get an appointment with his PCP, so he presents to [facility] for "a second opinion" for an intermittent problem for the past 5 years. Chief Complaint  Patient presents with  . Groin Pain   HPI Brett NeedleMichael c/o intermittent groin,pelvic and anal pain intermittent x 5 years. He states it feels like an electric shock at times. It lasts for a few seconds then resolves on its own.  He has seen his PCP and to urologistsfor this previously,, he would like a 2nd opinion.  Denies dysuria,hematuria, urinary frequency, problems with urinary flow, erectile dysfunction ,difficulty with BMs.,blood in stool, melena. On 11/18/2013, had CT abdomen and pelvis with contrast: WNL "No acute process in the abdomen or pelvis. The cecum and appendix are within the right central pelvis, no evidence of appendicitis" Past Medical History  Diagnosis Date  . GERD (gastroesophageal reflux disease)   . Femoral hernia   . RBBB (right bundle branch block with left anterior fascicular block)     hemblock  . Chest pain, atypical    Past Surgical History  Procedure Laterality Date  . None     Family History  Problem Relation Age of Onset  . Coronary artery disease      No family history of premature CAD   History  Substance Use Topics  . Smoking status: Never Smoker   . Smokeless tobacco: Not on file  . Alcohol Use: No     Comment: occasiona    Review of Systems  All other systems reviewed and are negative.   Allergies  Review of patient's allergies indicates no known allergies.  Home Medications   Prior to Admission medications   Medication Sig Start Date End Date Taking? Authorizing Provider  Amino Acid POWD by Does not apply route.    Historical Provider, MD  Flaxseed MISC by Does not apply route.    Historical Provider, MD  Misc  Natural Products (HM SAW PALMETTO COMPLEX) CAPS Take 1 capsule by mouth daily. 08/14/14   Laren BoomSean Hommel, DO  multivitamin (ONE-A-DAY MEN'S) TABS tablet Take 1 tablet by mouth daily.    Historical Provider, MD  Omega-3 Fatty Acids (FISH OIL) 1000 MG CAPS Take 1 capsule by mouth daily.      Historical Provider, MD   BP 131/85 mmHg  Pulse 80  Temp(Src) 97.7 F (36.5 C) (Oral)  Resp 16  Wt 191 lb (86.637 kg)  SpO2 98% Physical Exam  Constitutional: He is oriented to person, place, and time. He appears well-developed and well-nourished. No distress.  He appears anxious and worried. Talkative.  HENT:  Head: Normocephalic and atraumatic.  Mouth/Throat: Oropharynx is clear and moist.  Eyes: Conjunctivae and EOM are normal. Pupils are equal, round, and reactive to light. No scleral icterus.  Neck: Normal range of motion. No JVD present.  Cardiovascular: Normal rate and normal heart sounds.   Pulmonary/Chest: Effort normal and breath sounds normal.  Abdominal: Soft. He exhibits no distension and no mass. There is no tenderness. There is no guarding.  Genitourinary:  At his request, GU and rectal exam performed: Scrotum and penis: within normal limits without masses or lesions, except benign feeling varicocele left scrotum. Anal inspection: normal. Gloved Digital rectal exam: he was hypersensitive to digital exam, even first touch of anal area, he jumped away,  and exam difficult because of his hypersensitivity.--Prostate was smooth, 1+ size, without masses or nodules or irregularities.  In my opinion, prostate was not particularly more tender than the anal-rectal exam. Heme neg stool.  Musculoskeletal: Normal range of motion.  Lymphadenopathy:    He has no cervical adenopathy.  Neurological: He is alert and oriented to person, place, and time.  Skin: Skin is warm. No rash noted. He is not diaphoretic.  Psychiatric: His mood appears anxious. He expresses no homicidal and no suicidal ideation.   Nursing note and vitals reviewed.   ED Course  Procedures (including critical care time) Labs Review Labs Reviewed  URINE CULTURE   Narrative:    Performed at:  [facility]                [address], [address]                [place], [place] [id number]  POCT URINALYSIS DIP (MANUAL ENTRY)   Results for orders placed or performed during the hospital encounter of 04/09/15  Urine culture  Result Value Ref Range   Colony Count NO GROWTH    Organism ID, Bacteria NO GROWTH   POCT urinalysis dipstick (new)  Result Value Ref Range   Color, UA yellow    Clarity, UA clear    Glucose, UA neg    Bilirubin, UA negative    Bilirubin, UA negative    Spec Grav, UA 1.020 1.005 - 1.03   Blood, UA trace-intact    pH, UA 6.5 5 - 8   Protein Ur, POC negative    Urobilinogen, UA 0.2 0 - 1   Nitrite, UA Negative    Leukocytes, UA Negative    On 11/18/2013, had CT abdomen and pelvis with contrast: WNL "No acute process in the abdomen or pelvis. The cecum and appendix are within the right central pelvis, no evidence of appendicitis"  Today, urinalysis normal except trace microscopic blood.  MDM   1. Prostatodynia syndrome    Over 30 minutes spent, greater than 50% of the time spent for counseling and coordination of care. I explained at length to him, that I cannot make any other diagnosis at this time and this problem is beyond the scope of in urgent care visit. He likely does not have a acute or chronic prostatitis, but will send off urine culture. I do not think antibiotics are indicated at this time. I gave him a lengthy handout about chronic pelvic pain syndrome/Prostatodynia syndrome     He declined any other testing at this time. In my opinion, UA is within normal limits, but we'll send for urine culture because of trace microscopic hematuria. Advised him to follow-up with urologist if symptoms persist. Advised him to have urinalysis rechecked within the next few weeks. I  suggested that anxiety and his constant worrying may be factors and symptoms, and I suggested he discuss this with his PCP and or counselor.  Lajean Manesavid Massey, MD 04/11/15 2240

## 2015-04-11 LAB — URINE CULTURE
Colony Count: NO GROWTH
Organism ID, Bacteria: NO GROWTH

## 2015-04-26 ENCOUNTER — Emergency Department (INDEPENDENT_AMBULATORY_CARE_PROVIDER_SITE_OTHER)
Admission: EM | Admit: 2015-04-26 | Discharge: 2015-04-26 | Disposition: A | Discharge status: Home / Self Care | Payer: BLUE CROSS/BLUE SHIELD | Source: Home / Self Care | Attending: Family Medicine | Admitting: Family Medicine

## 2015-04-26 ENCOUNTER — Encounter: Payer: Self-pay | Admitting: Emergency Medicine

## 2015-04-26 DIAGNOSIS — L03119 Cellulitis of unspecified part of limb: Secondary | ICD-10-CM | POA: Diagnosis not present

## 2015-04-26 DIAGNOSIS — L02419 Cutaneous abscess of limb, unspecified: Secondary | ICD-10-CM

## 2015-04-26 MED ORDER — DOXYCYCLINE HYCLATE 100 MG PO CAPS: 100.0000 mg | ORAL_CAPSULE | Freq: Two times a day (BID) | ORAL | Status: DC

## 2015-04-26 NOTE — Discharge Instructions (Signed)
Leave bandage in place for 24 hours, then remove bandage and packing.  Keep wound bandaged until healed.  May take Ibuprofen 200mg , 4 tabs every 8 hours with food if needed for pain.

## 2015-04-26 NOTE — ED Notes (Signed)
Abscess on left pelvis x 3 days, hard and sore

## 2015-04-26 NOTE — ED Provider Notes (Signed)
CSN: [id number]     Arrival date & time 04/26/15  1341 History   First MD Initiated Contact with Patient 04/26/15 1359     Chief Complaint  Patient presents with  . Abscess      HPI Comments: Patient complains of three day history of tender nodule on left upper thigh that has gradually increased in size.  Patient is a 44 y.o. male presenting with abscess. The history is provided by the patient.  Abscess Abscess location: left superior anterior thigh. Size:  1.5cm diameter Abscess quality: fluctuance and painful   Abscess quality: not draining, no induration, no redness, no warmth and not weeping   Red streaking: no   Duration:  3 days Progression:  Worsening Pain details:    Quality:  Aching   Severity:  Mild   Duration:  3 days   Timing:  Constant   Progression:  Worsening Chronicity:  New Context: not skin injury   Relieved by:  Nothing Exacerbated by: contact. Ineffective treatments:  None tried Associated symptoms: no fatigue and no fever     Past Medical History  Diagnosis Date  . GERD (gastroesophageal reflux disease)   . Femoral hernia   . RBBB (right bundle branch block with left anterior fascicular block)     hemblock  . Chest pain, atypical    Past Surgical History  Procedure Laterality Date  . None     Family History  Problem Relation Age of Onset  . Coronary artery disease      No family history of premature CAD   History  Substance Use Topics  . Smoking status: Never Smoker   . Smokeless tobacco: Not on file  . Alcohol Use: No     Comment: occasiona    Review of Systems  Constitutional: Negative for fever and fatigue.  All other systems reviewed and are negative.   Allergies  Citric acid  Home Medications   Prior to Admission medications   Medication Sig Start Date End Date Taking? Authorizing Provider  Amino Acid POWD by Does not apply route.    Historical Provider, MD  doxycycline (VIBRAMYCIN) 100 MG capsule Take 1 capsule (100 mg  total) by mouth 2 (two) times daily. Take with food. 04/26/15   Lattie Haw, MD  Flaxseed MISC by Does not apply route.    Historical Provider, MD  Misc Natural Products (HM SAW PALMETTO COMPLEX) CAPS Take 1 capsule by mouth daily. 08/14/14   Laren Boom, DO  multivitamin (ONE-A-DAY MEN'S) TABS tablet Take 1 tablet by mouth daily.    Historical Provider, MD  Omega-3 Fatty Acids (FISH OIL) 1000 MG CAPS Take 1 capsule by mouth daily.      Historical Provider, MD   BP 142/88 mmHg  Pulse 90  Temp(Src) 98.1 F (36.7 C) (Oral)  Ht  (1.803 m)  Wt 192 lb (87.091 kg)  BMI 26.79 kg/m2  SpO2 99% Physical Exam  Constitutional: He is oriented to person, place, and time. He appears well-developed and well-nourished. No distress.  HENT:  Head: Normocephalic.  Eyes: Pupils are equal, round, and reactive to light.  Cardiovascular: Normal heart sounds.   Pulmonary/Chest: Breath sounds normal.  Genitourinary:     Left superior anterior thigh has a 1cm by 1.5cm mildly tender fluctuant cystic lesion without surrounding erythema.  Neurological: He is alert and oriented to person, place, and time.  Skin: Skin is warm and dry.  Nursing note and vitals reviewed.   ED Course  Procedures  Incise and drain cyst/abscess Risks and benefits of procedure explained to patient and verbal consent obtained.  Using sterile technique and local anesthesia with 1% lidocaine with epinephrine, cleansed affected area with Betadine and alcohol. Identified the most fluctuant area of lesion and incised with #11 blade.  Expressed minimal amount of blood and purulent material.  Although abscess cavity is quite shallow, inserted small length Iodoform gauze packing to maintain wound opening.  Bandage applied.  Patient tolerated well    Labs Reviewed  WOUND CULTURE         MDM   1. Cellulitis and abscess of leg    Wound culture pending.  Begin doxycycline 100mg  BID for staph coverage. Leave bandage in place  for 24 hours, then remove bandage and packing.  Keep wound bandaged until healed.  May take Ibuprofen 200mg , 4 tabs every 8 hours with food if needed for pain. Return for worsening symptoms     Lattie HawStephen A Rayven Hendrickson, MD 04/27/15 1100

## 2015-08-04 ENCOUNTER — Emergency Department (INDEPENDENT_AMBULATORY_CARE_PROVIDER_SITE_OTHER)
Admission: EM | Admit: 2015-08-04 | Discharge: 2015-08-04 | Disposition: A | Discharge status: Home / Self Care | Payer: BLUE CROSS/BLUE SHIELD | Source: Home / Self Care

## 2015-08-04 ENCOUNTER — Encounter: Payer: Self-pay | Admitting: *Deleted

## 2015-08-04 ENCOUNTER — Telehealth: Payer: Self-pay | Admitting: Family Medicine

## 2015-08-04 DIAGNOSIS — R109 Unspecified abdominal pain: Secondary | ICD-10-CM

## 2015-08-04 NOTE — ED Notes (Signed)
Pt c/o 2 days of intermittent pain on both sides/left and right abdomen. Denies N/V/D, last BM today. Also c/o erectile dysfunction x 2 recently. Pt has seen urology in the past. Encouraged to follow up with urology or PCP.

## 2015-08-04 NOTE — Discharge Instructions (Signed)
Flank Pain °Flank pain refers to pain that is located on the side of the body between the upper abdomen and the back. The pain may occur over a short period of time (acute) or may be long-term or reoccurring (chronic). It may be mild or severe. Flank pain can be caused by many things. °CAUSES  °Some of the more common causes of flank pain include: °· Muscle strains.   °· Muscle spasms.   °· A disease of your spine (vertebral disk disease).   °· A lung infection (pneumonia).   °· Fluid around your lungs (pulmonary edema).   °· A kidney infection.   °· Kidney stones.   °· A very painful skin rash caused by the chickenpox virus (shingles).   °· Gallbladder disease.   °HOME CARE INSTRUCTIONS  °Home care will depend on the cause of your pain. In general, °· Rest as directed by your caregiver. °· Drink enough fluids to keep your urine clear or pale yellow. °· Only take over-the-counter or prescription medicines as directed by your caregiver. Some medicines may help relieve the pain. °· Tell your caregiver about any changes in your pain. °· Follow up with your caregiver as directed. °SEEK IMMEDIATE MEDICAL CARE IF:  °· Your pain is not controlled with medicine.   °· You have new or worsening symptoms. °· Your pain increases.   °· You have abdominal pain.   °· You have shortness of breath.   °· You have persistent nausea or vomiting.   °· You have swelling in your abdomen.   °· You feel faint or pass out.   °· You have blood in your urine. °· You have a fever or persistent symptoms for more than 2-3 days. °· You have a fever and your symptoms suddenly get worse. °MAKE SURE YOU:  °· Understand these instructions. °· Will watch your condition. °· Will get help right away if you are not doing well or get worse. °Document Released: 01/11/2006 Document Revised: 08/14/2012 Document Reviewed: 07/04/2012 °ExitCare® Patient Information ©2015 [employer]. This information is not intended to replace advice given to you by your  health care provider. Make sure you discuss any questions you have with your health care provider. ° °

## 2015-08-04 NOTE — ED Provider Notes (Signed)
CSN: [id number]     Arrival date & time 08/04/15  1613 History   None    Chief Complaint  Patient presents with  . Abdominal Pain   (Consider location/radiation/quality/duration/timing/severity/associated sxs/prior Treatment) HPI Pt is a 44yo male presenting to [facility] with c/o mild intermittent bilateral flank pain that started 2 days ago. Reports last BM earlier today. He has not taken anything for pain. Pt does report using a machine to help with weighted sit-ups but states he works out daily. Denies new falls, heavy lifting or known injuries.  Denies fever, chills, n/v/d. Denies hx of kidney stones. Denies urinary symptoms. Pt does report 2 episodes of erectile dysfunction, would like help establishing steady primary care. He has seen urology for same but states when he goes to PCP he always sees someone different.  Past Medical History  Diagnosis Date  . GERD (gastroesophageal reflux disease)   . Femoral hernia   . RBBB (right bundle branch block with left anterior fascicular block)     hemblock  . Chest pain, atypical    Past Surgical History  Procedure Laterality Date  . None     Family History  Problem Relation Age of Onset  . Coronary artery disease      No family history of premature CAD   Social History  Substance Use Topics  . Smoking status: Never Smoker   . Smokeless tobacco: None  . Alcohol Use: No     Comment: occasiona    Review of Systems  Constitutional: Negative for fever and chills.  Respiratory: Negative for cough and shortness of breath.   Gastrointestinal: Positive for abdominal pain. Negative for nausea, vomiting and diarrhea.  Genitourinary: Positive for flank pain ( bilateral). Negative for dysuria, urgency, frequency, hematuria, decreased urine volume, discharge, penile swelling, scrotal swelling, penile pain and testicular pain.  Musculoskeletal: Positive for back pain (mild, bilateral mid-back). Negative for myalgias, joint swelling, gait problem,  neck pain and neck stiffness.  Skin: Negative for color change and wound.    Allergies  Citric acid  Home Medications   Prior to Admission medications   Medication Sig Start Date End Date Taking? Authorizing Provider  Amino Acid POWD by Does not apply route.    Historical Provider, MD  Flaxseed MISC by Does not apply route.    Historical Provider, MD  Misc Natural Products (HM SAW PALMETTO COMPLEX) CAPS Take 1 capsule by mouth daily. 08/14/14   Laren Boom, DO  multivitamin (ONE-A-DAY MEN'S) TABS tablet Take 1 tablet by mouth daily.    Historical Provider, MD  Omega-3 Fatty Acids (FISH OIL) 1000 MG CAPS Take 1 capsule by mouth daily.      Historical Provider, MD   Meds Ordered and Administered this Visit  Medications - No data to display  BP 115/75 mmHg  Pulse 77  Temp(Src) 98.8 F (37.1 C) (Oral)  Ht  (1.803 m)  Wt 193 lb (87.544 kg)  BMI 26.93 kg/m2  SpO2 100% No data found.   Physical Exam  Constitutional: He appears well-developed and well-nourished.  HENT:  Head: Normocephalic and atraumatic.  Eyes: Conjunctivae are normal. No scleral icterus.  Neck: Normal range of motion.  Cardiovascular: Normal rate, regular rhythm and normal heart sounds.   Pulmonary/Chest: Effort normal and breath sounds normal. No respiratory distress. He has no wheezes. He has no rales. He exhibits no tenderness.  Abdominal: Soft. Bowel sounds are normal. He exhibits no distension and no mass. There is tenderness. There is no  rebound and no guarding.  Soft, mild tenderness to bilateral flanks. No rebound or guarding. No masses palpated. No CVAT  Musculoskeletal: Normal range of motion.  Neurological: He is alert.  Skin: Skin is warm and dry.  Nursing note and vitals reviewed.   ED Course  Procedures (including critical care time)  Labs Review Labs Reviewed - No data to display  Imaging Review No results found.    MDM   1. Bilateral flank pain    Pt c/o bilateral flank  pain for 2 days. Pain is mild, intermittent. Mild tenderness on exam but not concerning for surgical abdomen including but not limited to SBO or diverticulitis.  Pain likely due to muscle strain after recent use of weighted sit-up machine at gym. Denies urinary symptoms, doubt pyelonephrosis. No hx of renal stones. Pt did mention hx of erectile dysfunction and would like help getting definitive care. Encouraged pt to establish primary care and request PCP to get medical reports from prior urology visits so a proper treatment plan can be implemented or additional testing ordered. Patient verbalized understanding and agreement with treatment plan.      Junius Finner, PA-C 08/04/15 [phone]

## 2015-08-04 NOTE — Telephone Encounter (Signed)
Patient walked in request to change providers from [place] to [place]. Thanks

## 2015-08-05 ENCOUNTER — Encounter: Payer: Self-pay | Admitting: Family Medicine

## 2015-08-05 ENCOUNTER — Ambulatory Visit (INDEPENDENT_AMBULATORY_CARE_PROVIDER_SITE_OTHER): Payer: BLUE CROSS/BLUE SHIELD | Admitting: Family Medicine

## 2015-08-05 VITALS — BP 120/78 | HR 82 | Wt 193.0 lb

## 2015-08-05 DIAGNOSIS — R109 Unspecified abdominal pain: Secondary | ICD-10-CM | POA: Insufficient documentation

## 2015-08-05 DIAGNOSIS — N529 Male erectile dysfunction, unspecified: Secondary | ICD-10-CM | POA: Diagnosis not present

## 2015-08-05 DIAGNOSIS — R1084 Generalized abdominal pain: Secondary | ICD-10-CM

## 2015-08-05 NOTE — Patient Instructions (Signed)
Thank you for coming in today. Get labs in the morning around 8:00 in the morning Avoid excessive crunches Follow-up as needed If your belly pain worsens, or you have high fever, bad vomiting, blood in your stool or black tarry stool go to the Emergency Room.

## 2015-08-05 NOTE — Assessment & Plan Note (Signed)
Check testosterone, FSH and LH. Watchful waiting. Return as needed.

## 2015-08-05 NOTE — Assessment & Plan Note (Addendum)
Muscle soreness. Much better with rest. Resume abdominal exercises as needed.

## 2015-08-05 NOTE — Telephone Encounter (Signed)
That is fine with me.

## 2015-08-05 NOTE — Progress Notes (Signed)
Brett Hall is a 44 y.o. male who presents to [facility]  today for abdominal pain.  1) patient was seen in urgent care recently for general abdominal wall pain. Symptoms occurred about a week and were persistent while doing crush exercises at the gym. He stopped doing crunches in the day and the pain seemed to improve significantly. He notes the pain is essentially gone now. He denies any fevers chills nausea vomiting or diarrhea.  2) erectile dysfunction: Patient had one episode where he lost the erection while having sex. This happened recently. He has not had any recurrence. He is able to have a normal erection in the morning. He denies any change in libido. 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 To his knowledge never had any laboratory workup for this problem yet.   Past Medical History  Diagnosis Date  . GERD (gastroesophageal reflux disease)   . Femoral hernia   . RBBB (right bundle branch block with left anterior fascicular block)     hemblock  . Chest pain, atypical    Past Surgical History  Procedure Laterality Date  . None     Social History  Substance Use Topics  . Smoking status: Never Smoker   . Smokeless tobacco: Not on file  . Alcohol Use: No     Comment: occasiona   family history includes Coronary artery disease in an other family member.  ROS as above Medications: Current Outpatient Prescriptions  Medication Sig Dispense Refill  . Amino Acid POWD by Does not apply route.    . Flaxseed MISC by Does not apply route.    . Misc Natural Products (HM SAW PALMETTO COMPLEX) CAPS Take 1 capsule by mouth daily.    . multivitamin (ONE-A-DAY MEN'S) TABS tablet Take 1 tablet by mouth daily.    . Omega-3 Fatty Acids (FISH OIL) 1000 MG CAPS Take 1 capsule by mouth daily.       No current facility-administered medications for this visit.   Allergies  Allergen Reactions  .  Citric Acid      Exam:  BP 120/78 mmHg  Pulse 82  Wt 193 lb (87.544 kg) Gen: Well NAD HEENT: EOMI,  MMM Lungs: Normal work of breathing. CTABL Heart: RRR no MRG Abd: NABS, Soft. Nondistended, Nontender Exts: Brisk capillary refill, warm and well perfused.   No results found for this or any previous visit (from the past 24 hour(s)). No results found.   Please see individual assessment and plan sections.

## 2015-08-05 NOTE — Telephone Encounter (Signed)
Ok by me

## 2015-08-11 LAB — TESTOSTERONE, FREE, TOTAL, SHBG
SEX HORMONE BINDING: 19 nmol/L (ref 10–50)
TESTOSTERONE FREE: 57.8 pg/mL (ref 47.0–244.0)
TESTOSTERONE: 227 ng/dL — AB (ref 300–890)
Testosterone-% Free: 2.5 % (ref 1.6–2.9)

## 2015-08-11 LAB — LUTEINIZING HORMONE: LH: 3 m[IU]/mL (ref 1.5–9.3)

## 2015-08-11 LAB — FOLLICLE STIMULATING HORMONE: FSH: 10.3 m[IU]/mL (ref 1.4–18.1)

## 2015-08-11 NOTE — Progress Notes (Signed)
Quick Note:  Testosterone is low. Patient will return in the morning in a few weeks for recheck to confirm. ______

## 2015-08-11 NOTE — Addendum Note (Signed)
Addended by: Minna Antis T on: 08/11/2015 02:23 PM   Modules accepted: Orders

## 2015-10-20 ENCOUNTER — Emergency Department (INDEPENDENT_AMBULATORY_CARE_PROVIDER_SITE_OTHER)
Admission: EM | Admit: 2015-10-20 | Discharge: 2015-10-20 | Disposition: A | Discharge status: Home / Self Care | Payer: BLUE CROSS/BLUE SHIELD | Source: Home / Self Care | Attending: Family Medicine | Admitting: Family Medicine

## 2015-10-20 ENCOUNTER — Encounter: Payer: Self-pay | Admitting: *Deleted

## 2015-10-20 ENCOUNTER — Emergency Department (INDEPENDENT_AMBULATORY_CARE_PROVIDER_SITE_OTHER): Payer: BLUE CROSS/BLUE SHIELD

## 2015-10-20 DIAGNOSIS — R0789 Other chest pain: Secondary | ICD-10-CM | POA: Diagnosis not present

## 2015-10-20 DIAGNOSIS — R0781 Pleurodynia: Secondary | ICD-10-CM

## 2015-10-20 NOTE — Discharge Instructions (Signed)
Apply ice pack for 20 to 30 minutes, 3 to 4 times daily  Continue until pain  decreases. May take Ibuprofen 200mg, 4 tabs every 8 hours with food.   Call if rash develops. 

## 2015-10-20 NOTE — ED Notes (Signed)
Pt c/o center of chest pain that resolved within 1 hour 2 days ago. The pain came back yesterday more on the LT side of his chest. He describes it as a tenderness to the touch and a pinching pain at times. Denies SOB, nausea or arm pain.

## 2015-10-20 NOTE — ED Provider Notes (Signed)
CSN: [id number]     Arrival date & time 10/20/15  1415 History   First MD Initiated Contact with Patient 10/20/15 1427     Chief Complaint  Patient presents with  . Chest Pain      HPI Comments: Two days ago while sitting, patient had a brief episode of vague non-radiating chest pain.  The pain lasted several seconds before resolving spontaneously.  Yesterday he noted vague soreness in his left anterior chest.  He recalls no injury or change in physical activities.  No shortness of breath.  No nausea/vomiting.  No cough. No family history of cardiovascular disease.    Patient is a 44 y.o. male presenting with chest pain. The history is provided by the patient.  Chest Pain Pain location:  L chest Pain quality: aching   Pain radiates to:  Does not radiate Pain severity:  Mild Onset quality:  Sudden Duration:  2 days Timing:  Constant Progression:  Unchanged Chronicity:  New Context: at rest   Context: not breathing, not eating, no movement, no stress and no trauma   Relieved by:  None tried Exacerbated by: palpation. Ineffective treatments:  None tried Associated symptoms: no abdominal pain, no AICD problem, no anorexia, no anxiety, no back pain, no cough, no diaphoresis, no dizziness, no dysphagia, no fatigue, no fever, no headache, no heartburn, no lower extremity edema, no nausea, no near-syncope, no numbness, no palpitations, no shortness of breath, no syncope, not vomiting and no weakness     Past Medical History  Diagnosis Date  . GERD (gastroesophageal reflux disease)   . Femoral hernia   . RBBB (right bundle branch block with left anterior fascicular block)     hemblock  . Chest pain, atypical    Past Surgical History  Procedure Laterality Date  . None     Family History  Problem Relation Age of Onset  . Coronary artery disease      No family history of premature CAD   Social History  Substance Use Topics  . Smoking status: Never Smoker   . Smokeless tobacco:  None  . Alcohol Use: No     Comment: occasiona    Review of Systems  Constitutional: Negative for fever, diaphoresis and fatigue.  HENT: Negative for trouble swallowing.   Respiratory: Negative for cough and shortness of breath.   Cardiovascular: Positive for chest pain. Negative for palpitations, syncope and near-syncope.  Gastrointestinal: Negative for heartburn, nausea, vomiting, abdominal pain and anorexia.  Musculoskeletal: Negative for back pain.  Neurological: Negative for dizziness, weakness, numbness and headaches.  All other systems reviewed and are negative.   Allergies  Citric acid  Home Medications   Prior to Admission medications   Medication Sig Start Date End Date Taking? Authorizing Provider  Amino Acid POWD by Does not apply route.    Historical Provider, MD  Flaxseed MISC by Does not apply route.    Historical Provider, MD  Misc Natural Products (HM SAW PALMETTO COMPLEX) CAPS Take 1 capsule by mouth daily. 08/14/14   Laren Boom, DO  multivitamin (ONE-A-DAY MEN'S) TABS tablet Take 1 tablet by mouth daily.    Historical Provider, MD  Omega-3 Fatty Acids (FISH OIL) 1000 MG CAPS Take 1 capsule by mouth daily.      Historical Provider, MD   Meds Ordered and Administered this Visit  Medications - No data to display  BP 117/81 mmHg  Pulse 87  Temp(Src) 98.1 F (36.7 C) (Oral)  Resp 18  Ht 5'  11.5" (1.816 m)  Wt 192 lb (87.091 kg)  BMI 26.41 kg/m2  SpO2 98% No data found.   Physical Exam  Constitutional: He is oriented to person, place, and time. He appears well-developed and well-nourished. No distress.  HENT:  Head: Normocephalic.  Nose: Nose normal.  Mouth/Throat: Oropharynx is clear and moist.  Eyes: Conjunctivae and EOM are normal. Pupils are equal, round, and reactive to light.  Neck: Normal range of motion. Neck supple. No JVD present. Thyromegaly present.  Cardiovascular: Normal heart sounds.   Pulmonary/Chest: Breath sounds normal. He exhibits  tenderness.    Mild localized rib and intercostal muscle tenderness to palpation left anterior/lateral chest as noted on diagram.    Abdominal: Bowel sounds are normal. He exhibits no distension. There is no tenderness. There is no rebound and no guarding.  Musculoskeletal: Normal range of motion. He exhibits no edema or tenderness.  Lymphadenopathy:    He has no cervical adenopathy.  Neurological: He is alert and oriented to person, place, and time.  Skin: Skin is warm and dry. No rash noted. He is not diaphoretic. No erythema.  Nursing note and vitals reviewed.   ED Course  Procedures  None    Labs Reviewed -  EKG: Rate:  84 BPM PR:  154 msec QT:  352 msec QTcH:  394 msec QRSD:  94 msec QRS axis:  81 degrees Interpretation:  Sinus Rhythm; within normal limits   Imaging Review Dg Ribs Unilateral W/chest Left  10/20/2015  CLINICAL DATA:  Acute left rib pain without known injury. EXAM: LEFT RIBS AND CHEST - 3+ VIEW COMPARISON:  None. FINDINGS: No fracture or other bone lesions are seen involving the ribs. There is no evidence of pneumothorax or pleural effusion. Both lungs are clear. Heart size and mediastinal contours are within normal limits. IMPRESSION: Normal left ribs. Electronically Signed   By: Lupita RaiderJames  Green Jr, M.D.   On: 10/20/2015 14:58      MDM   1. Chest pain, musculoskeletal; ?early shingles   Treat symptoms for now: Apply ice pack for 20 to 30 minutes, 3 to 4 times daily  Continue until pain decreases.  May take Ibuprofen 200mg , 4 tabs every 8 hours with food.  Call if rash develops (would call in Valtrex) Followup with Family Doctor if not improved in about 2 weeks.    Lattie HawStephen A Seydou Hearns, MD 10/20/15 [phone]

## 2015-10-24 ENCOUNTER — Telehealth: Payer: Self-pay | Admitting: *Deleted

## 2016-06-01 DIAGNOSIS — I861 Scrotal varices: Secondary | ICD-10-CM | POA: Diagnosis not present

## 2016-06-27 ENCOUNTER — Ambulatory Visit: Payer: BLUE CROSS/BLUE SHIELD | Admitting: Family Medicine

## 2016-06-28 ENCOUNTER — Ambulatory Visit: Payer: BLUE CROSS/BLUE SHIELD | Admitting: Family Medicine

## 2016-07-31 ENCOUNTER — Encounter: Payer: Self-pay | Admitting: Family Medicine

## 2016-07-31 ENCOUNTER — Ambulatory Visit (INDEPENDENT_AMBULATORY_CARE_PROVIDER_SITE_OTHER): Payer: BLUE CROSS/BLUE SHIELD | Admitting: Family Medicine

## 2016-07-31 VITALS — BP 120/61 | HR 77 | Wt 193.0 lb

## 2016-07-31 DIAGNOSIS — Z0000 Encounter for general adult medical examination without abnormal findings: Secondary | ICD-10-CM

## 2016-07-31 DIAGNOSIS — M722 Plantar fascial fibromatosis: Secondary | ICD-10-CM | POA: Diagnosis not present

## 2016-07-31 DIAGNOSIS — Z0189 Encounter for other specified special examinations: Secondary | ICD-10-CM

## 2016-07-31 NOTE — Progress Notes (Signed)
Subjective:    Patient ID: Brett Hall, male    DOB: [birth year], 45 y.o.   MRN: [id number]  HPI 45 year old male comes in today for complete physical exam.Click is doing well overall. He exercises regularly and feels like he eats very healthy. He tries to eat lean meats and lots of vegetables. He denies any just digestive problems. No chest pain shortness of breath with exercise.  He does complain of bilateral arch pain. It bothers him most when he first gets out of bed and puts his weight on his feet and then once he gets going infection feels better. Sometimes some noticed that if he's been sitting for long. And then stands up as well. No swelling or trauma or injury. He's not really tried any specific treatment for it.  Review of Systems comprehensive of review of systems is negative.  BP 120/61   Pulse 77   Wt 193 lb (87.5 kg)   SpO2 100%   BMI 26.54 kg/m     Allergies  Allergen Reactions  . Citric Acid     Past Medical History:  Diagnosis Date  . Chest pain, atypical   . Femoral hernia   . GERD (gastroesophageal reflux disease)   . RBBB (right bundle branch block with left anterior fascicular block)    hemblock    Past Surgical History:  Procedure Laterality Date  . none      Social History   Social History  . Marital status: Single    Spouse name: N/A  . Number of children: N/A  . Years of education: N/A   Occupational History  . Realtor    Social History Main Topics  . Smoking status: Never Smoker  . Smokeless tobacco: Not on file  . Alcohol use No     Comment: occasiona  . Drug use: No  . Sexual activity: Not on file   Other Topics Concern  . Not on file   Social History Narrative  . No narrative on file    Family History  Problem Relation Age of Onset  . Coronary artery disease      No family history of premature CAD    Outpatient Encounter Prescriptions as of 07/31/2016  Medication Sig  . [DISCONTINUED] Amino Acid POWD by Does not  apply route.  . [DISCONTINUED] Flaxseed MISC by Does not apply route.  . [DISCONTINUED] Misc Natural Products (HM SAW PALMETTO COMPLEX) CAPS Take 1 capsule by mouth daily.  . [DISCONTINUED] multivitamin (ONE-A-DAY MEN'S) TABS tablet Take 1 tablet by mouth daily.  . [DISCONTINUED] Omega-3 Fatty Acids (FISH OIL) 1000 MG CAPS Take 1 capsule by mouth daily.     No facility-administered encounter medications on file as of 07/31/2016.          Objective:   Physical Exam  Constitutional: He is oriented to person, place, and time. He appears well-developed and well-nourished.  HENT:  Head: Normocephalic and atraumatic.  Right Ear: External ear normal.  Left Ear: External ear normal.  Nose: Nose normal.  Mouth/Throat: Oropharynx is clear and moist.  Eyes: Conjunctivae and EOM are normal. Pupils are equal, round, and reactive to light.  Neck: Normal range of motion. Neck supple. No thyromegaly present.  Cardiovascular: Normal rate, regular rhythm, normal heart sounds and intact distal pulses.   Pulmonary/Chest: Effort normal and breath sounds normal.  Abdominal: Soft. Bowel sounds are normal. He exhibits no distension and no mass. There is no tenderness. There is no rebound and no guarding.  Musculoskeletal:  Normal range of motion.  Lymphadenopathy:    He has no cervical adenopathy.  Neurological: He is alert and oriented to person, place, and time. He has normal reflexes.  Skin: Skin is warm and dry.  Psychiatric: He has a normal mood and affect. His behavior is normal. Judgment and thought content normal.   Non-tender over the heels and arches bilaterally. Normal flexion extension of ankles and feet bilaterally.No nodules, etc.       Assessment & Plan:  CPE Keep up a regular exercise program and make sure you are eating a healthy diet Try to eat 4 servings of dairy a day, or if you are lactose intolerant take a calcium with vitamin D daily.  Your vaccines are up to date.    Bilat  plantar fascititis - Discussed diagnosis. Handout given on stretches and exercises as well as a therapy band. Recommend ICU massage and anti-inflammatory for the next 5-7 days if he tolerates it without any GI side effects. If not improving them please give us a call and will refer him for custom orthotics and possibly injection.

## 2016-07-31 NOTE — Patient Instructions (Signed)
Keep up a regular exercise program and make sure you are eating a healthy diet Try to eat 4 servings of dairy a day, or if you are lactose intolerant take a calcium with vitamin D daily.  Your vaccines are up to date.   

## 2016-08-01 LAB — COMPLETE METABOLIC PANEL WITH GFR
ALBUMIN: 4.2 g/dL (ref 3.6–5.1)
ALK PHOS: 45 U/L (ref 40–115)
ALT: 21 U/L (ref 9–46)
AST: 21 U/L (ref 10–40)
BILIRUBIN TOTAL: 0.4 mg/dL (ref 0.2–1.2)
BUN: 11 mg/dL (ref 7–25)
CALCIUM: 9.4 mg/dL (ref 8.6–10.3)
CO2: 28 mmol/L (ref 20–31)
Chloride: 103 mmol/L (ref 98–110)
Creat: 1.08 mg/dL (ref 0.60–1.35)
GFR, EST NON AFRICAN AMERICAN: 84 mL/min (ref >=60)
GFR, Est African American: >89 mL/min (ref >=60)
Glucose, Bld: 87 mg/dL (ref 65–99)
POTASSIUM: 4.3 mmol/L (ref 3.5–5.3)
Sodium: 136 mmol/L (ref 135–146)
TOTAL PROTEIN: 7.2 g/dL (ref 6.1–8.1)

## 2016-08-01 LAB — LIPID PANEL
CHOL/HDL RATIO: 3.7 ratio (ref <=5.0)
CHOLESTEROL: 202 mg/dL — AB (ref 125–200)
HDL: 55 mg/dL (ref >=40)
LDL Cholesterol: 120 mg/dL (ref <130)
Triglycerides: 134 mg/dL (ref <150)
VLDL: 27 mg/dL (ref <30)

## 2016-08-01 LAB — PSA: PSA: 0.7 ng/mL (ref <=4.0)

## 2016-11-21 ENCOUNTER — Encounter: Payer: Self-pay | Admitting: Family Medicine

## 2016-11-21 ENCOUNTER — Ambulatory Visit (INDEPENDENT_AMBULATORY_CARE_PROVIDER_SITE_OTHER): Payer: BLUE CROSS/BLUE SHIELD | Admitting: Family Medicine

## 2016-11-21 VITALS — BP 117/68 | HR 93 | Temp 98.2°F | Wt 198.0 lb

## 2016-11-21 DIAGNOSIS — N410 Acute prostatitis: Secondary | ICD-10-CM

## 2016-11-21 DIAGNOSIS — J40 Bronchitis, not specified as acute or chronic: Secondary | ICD-10-CM

## 2016-11-21 MED ORDER — CIPROFLOXACIN HCL 500 MG PO TABS: 500.0000 mg | ORAL_TABLET | Freq: Two times a day (BID) | ORAL | 0 refills | Status: DC

## 2016-11-21 MED ORDER — PREDNISONE 10 MG PO TABS
30.0000 mg | ORAL_TABLET | Freq: Every day | ORAL | 0 refills | Status: DC
Start: 2016-11-21 — End: 2017-08-10

## 2016-11-21 MED ORDER — BENZONATATE 200 MG PO CAPS: 200.0000 mg | ORAL_CAPSULE | Freq: Three times a day (TID) | ORAL | 0 refills | Status: DC | PRN

## 2016-11-21 NOTE — Patient Instructions (Signed)
Thank you for coming in today. Call or go to the emergency room if you get worse, have trouble breathing, have chest pains, or palpitations.    Acute Bronchitis, Adult Acute bronchitis is when air tubes (bronchi) in the lungs suddenly get swollen. The condition can make it hard to breathe. It can also cause these symptoms:  A cough.  Coughing up clear, yellow, or green mucus.  Wheezing.  Chest congestion.  Shortness of breath.  A fever.  Body aches.  Chills.  A sore throat. Follow these instructions at home: Medicines  Take over-the-counter and prescription medicines only as told by your doctor.  If you were prescribed an antibiotic medicine, take it as told by your doctor. Do not stop taking the antibiotic even if you start to feel better. General instructions  Rest.  Drink enough fluids to keep your pee (urine) clear or pale yellow.  Avoid smoking and secondhand smoke. If you smoke and you need help quitting, ask your doctor. Quitting will help your lungs heal faster.  Use an inhaler, cool mist vaporizer, or humidifier as told by your doctor.  Keep all follow-up visits as told by your doctor. This is important. How is this prevented? To lower your risk of getting this condition again:  Wash your hands often with soap and water. If you cannot use soap and water, use hand sanitizer.  Avoid contact with people who have cold symptoms.  Try not to touch your hands to your mouth, nose, or eyes.  Make sure to get the flu shot every year. Contact a doctor if:  Your symptoms do not get better in 2 weeks. Get help right away if:  You cough up blood.  You have chest pain.  You have very bad shortness of breath.  You become dehydrated.  You faint (pass out) or keep feeling like you are going to pass out.  You keep throwing up (vomiting).  You have a very bad headache.  Your fever or chills gets worse. This information is not intended to replace advice  given to you by your health care provider. Make sure you discuss any questions you have with your health care provider. Document Released: 05/08/2008 Document Revised: 06/28/2016 Document Reviewed: 05/10/2016 Elsevier Interactive Patient Education  2017 Elsevier Inc.    Prostatitis Prostatitis is swelling of the prostate gland. The prostate helps to make semen. It is below a man's bladder, in front of the rectum. There are different types of prostatitis. Follow these instructions at home:  Take over-the-counter and prescription medicines only as told by your doctor.  If you were prescribed an antibiotic medicine, take it as told by your doctor. Do not stop taking the antibiotic even if you start to feel better.  If your doctor prescribed exercises, do them as directed.  Take sitz baths as told by your doctor. To take a sitz bath, sit in warm water that is deep enough to cover your hips and butt.  Keep all follow-up visits as told by your doctor. This is important. Contact a doctor if:  Your symptoms get worse.  You have a fever. Get help right away if:  You have chills.  You feel sick to your stomach (nauseous).  You throw up (vomit).  You feel light-headed.  You feel like you might pass out (faint).  You cannot pee (urinate).  You have blood or clumps of blood (blood clots) in your pee (urine). This information is not intended to replace advice given to you  by your health care provider. Make sure you discuss any questions you have with your health care provider. Document Released: 05/21/2012 Document Revised: 08/10/2016 Document Reviewed: 08/10/2016 Elsevier Interactive Patient Education  2017 Elsevier Inc.''   Ciprofloxacin tablets What is this medicine? CIPROFLOXACIN (sip roe FLOX a sin) is a quinolone antibiotic. It is used to treat certain kinds of bacterial infections. It will not work for colds, flu, or other viral infections. This medicine may be used for  other purposes; ask your health care provider or pharmacist if you have questions. COMMON BRAND NAME(S): Cipro What should I tell my health care provider before I take this medicine? They need to know if you have any of these conditions: -bone problems -history of low levels of potassium in the blood -joint problems -irregular heartbeat -kidney disease -myasthenia gravis -seizures -tendon problems -tingling of the fingers or toes, or other nerve disorder -an unusual or allergic reaction to ciprofloxacin, other antibiotics or medicines, foods, dyes, or preservatives -pregnant or trying to get pregnant -breast-feeding How should I use this medicine? Take this medicine by mouth with a glass of water. Follow the directions on the prescription label. Take your medicine at regular intervals. Do not take your medicine more often than directed. Take all of your medicine as directed even if you think your are better. Do not skip doses or stop your medicine early. You can take this medicine with food or on an empty stomach. It can be taken with a meal that contains dairy or calcium, but do not take it alone with a dairy product, like milk or yogurt or calcium-fortified juice. A special MedGuide will be given to you by the pharmacist with each prescription and refill. Be sure to read this information carefully each time. Talk to your pediatrician regarding the use of this medicine in children. Special care may be needed. Overdosage: If you think you have taken too much of this medicine contact a poison control center or emergency room at once. NOTE: This medicine is only for you. Do not share this medicine with others. What if I miss a dose? If you miss a dose, take it as soon as you can. If it is almost time for your next dose, take only that dose. Do not take double or extra doses. What may interact with this medicine? Do not take this medicine with any of the following  medications: -cisapride -dofetilide -dronedarone -flibanserin -lomitapide -pimozide -thioridazine -tizanidine -ziprasidone This medicine may also interact with the following medications: -antacids -birth control pills -caffeine -certain medicines for diabetes, like glipizide or glyburide -certain medicines that treat or prevent blood clots like warfarin -clozapine -cyclosporine -didanosine (ddI) buffered tablets or powder -duloxetine -lanthanum carbonate -lidocaine -methotrexate -multivitamins -NSAIDS, medicines for pain and inflammation, like ibuprofen or naproxen -olanzapine -omeprazole -other medicines that prolong the QT interval (cause an abnormal heart rhythm) -phenytoin -probenecid -ropinirole -sevelamer -sildenafil -sucralfate -theophylline -zolpidem This list may not describe all possible interactions. Give your health care provider a list of all the medicines, herbs, non-prescription drugs, or dietary supplements you use. Also tell them if you smoke, drink alcohol, or use illegal drugs. Some items may interact with your medicine. What should I watch for while using this medicine? Tell your doctor or health care professional if your symptoms do not improve. Do not treat diarrhea with over the counter products. Contact your doctor if you have diarrhea that lasts more than 2 days or if it is severe and watery. You may get drowsy  or dizzy. Do not drive, use machinery, or do anything that needs mental alertness until you know how this medicine affects you. Do not stand or sit up quickly, especially if you are an older patient. This reduces the risk of dizzy or fainting spells. This medicine can make you more sensitive to the sun. Keep out of the sun. If you cannot avoid being in the sun, wear protective clothing and use sunscreen. Do not use sun lamps or tanning beds/booths. Avoid antacids, aluminum, calcium, iron, magnesium, and zinc products for 6 hours before and 2  hours after taking a dose of this medicine. What side effects may I notice from receiving this medicine? Side effects that you should report to your doctor or health care professional as soon as possible: -allergic reactions like skin rash or hives, swelling of the face, lips, or tongue -anxious -confusion -depressed mood -diarrhea -fast, irregular heartbeat -hallucination, loss of contact with reality -joint, muscle, or tendon pain or swelling -pain, tingling, numbness in the hands or feet -suicidal thoughts or other mood changes -sunburn -unusually weak or tired Side effects that usually do not require medical attention (report to your doctor or health care professional if they continue or are bothersome): -dry mouth -headache -nausea -trouble sleeping This list may not describe all possible side effects. Call your doctor for medical advice about side effects. You may report side effects to FDA at 1-800-FDA-1088. Where should I keep my medicine? Keep out of the reach of children. Store at room temperature below 30 degrees C (86 degrees F). Keep container tightly closed. Throw away any unused medicine after the expiration date. NOTE: This sheet is a summary. It may not cover all possible information. If you have questions about this medicine, talk to your doctor, pharmacist, or health care provider.  2017 Elsevier/Gold Standard (2016-06-30 14:42:02)

## 2016-11-21 NOTE — Progress Notes (Signed)
       Brett LemonsMichael Hall is a 45 y.o. male [name] today for cough congestion runny nose. Symptoms present for about 2 weeks. Cough is productive of clear sputum. Cough does occasionally interfere with sleep and activities. He denies significant shortness of breath or wheezing vomiting or diarrhea. He's tried some over-the-counter medicines which helped a bit.  Pelvic discomfort: Patient has a history of an enlarged prostate. For the last month or so he's noted mild pelvic discomfort. Sometimes it occurs with urination. He denies any difficulty with bowel movements fevers or chills vomiting or diarrhea.   Past Medical History:  Diagnosis Date  . Chest pain, atypical   . Femoral hernia   . GERD (gastroesophageal reflux disease)   . RBBB (right bundle branch block with left anterior fascicular block)    hemblock   Past Surgical History:  Procedure Laterality Date  . none     Social History  Substance Use Topics  . Smoking status: Never Smoker  . Smokeless tobacco: Not on file  . Alcohol use No     Comment: occasiona   family history is not on file.  ROS as above:  Medications: Current Outpatient Prescriptions  Medication Sig Dispense Refill  . benzonatate (TESSALON) 200 MG capsule Take 1 capsule (200 mg total) by mouth 3 (three) times daily as needed for cough. 45 capsule 0  . ciprofloxacin (CIPRO) 500 MG tablet Take 1 tablet (500 mg total) by mouth 2 (two) times daily. 60 tablet 0  . predniSONE (DELTASONE) 10 MG tablet Take 3 tablets (30 mg total) by mouth daily with breakfast. 15 tablet 0   No current facility-administered medications for this visit.    Allergies  Allergen Reactions  . Citric Acid     Health Maintenance Health Maintenance  Topic Date Due  . HIV Screening  07/20/1988  . INFLUENZA VACCINE  12/04/2019 (Originally 07/04/2016)    . TETANUS/TDAP  06/15/2019     Exam:  BP 117/68   Pulse 93   Temp 98.2 F (36.8 C) (Oral)   Wt 198 lb (89.8 kg)   SpO2 99%   BMI 27.23 kg/m  Gen: Well NAD HEENT: EOMI,  MMM Posterior pharynx cobblestoning. Normal tympanic membranes bilaterally. Lungs: Normal work of breathing. Coarse breath sounds bilaterally Heart: RRR no MRG Abd: NABS, Soft. Nondistended, Nontender Exts: Brisk capillary refill, warm and well perfused.  Prostate exam deferred  Lab Results  Component Value Date   PSA 0.7 07/31/2016   PSA 0.80 06/08/2014   PSA 0.89 02/04/2013     No results found for this or any previous visit (from the past 72 hour(s)). No results found.    Assessment and Plan: 45 y.o. male with  Bronchitis. Likely viral. Treat with prednisone and Tessalon Perles. Continue over-the-counter medications as needed. Return as needed.  Pelvic discomfort very likely prostatitis based on a history of enlarged prostate. Rectal exam declined today. Plan to treat with Cipro and follow-up with PCP.   No orders of the defined types were placed in this encounter.   Discussed warning signs or symptoms. Please see discharge instructions. Patient expresses understanding.

## 2017-05-09 ENCOUNTER — Emergency Department (INDEPENDENT_AMBULATORY_CARE_PROVIDER_SITE_OTHER): Payer: BLUE CROSS/BLUE SHIELD

## 2017-05-09 ENCOUNTER — Emergency Department (INDEPENDENT_AMBULATORY_CARE_PROVIDER_SITE_OTHER)
Admission: EM | Admit: 2017-05-09 | Discharge: 2017-05-09 | Disposition: A | Discharge status: Home / Self Care | Payer: BLUE CROSS/BLUE SHIELD | Source: Home / Self Care | Attending: Family Medicine | Admitting: Family Medicine

## 2017-05-09 DIAGNOSIS — W228XXA Striking against or struck by other objects, initial encounter: Secondary | ICD-10-CM | POA: Diagnosis not present

## 2017-05-09 DIAGNOSIS — S60450A Superficial foreign body of right index finger, initial encounter: Secondary | ICD-10-CM | POA: Diagnosis not present

## 2017-05-09 DIAGNOSIS — S60451A Superficial foreign body of left index finger, initial encounter: Secondary | ICD-10-CM

## 2017-05-09 DIAGNOSIS — L089 Local infection of the skin and subcutaneous tissue, unspecified: Secondary | ICD-10-CM

## 2017-05-09 DIAGNOSIS — S62306A Unspecified fracture of fifth metacarpal bone, right hand, initial encounter for closed fracture: Secondary | ICD-10-CM | POA: Diagnosis not present

## 2017-05-09 MED ORDER — DOXYCYCLINE HYCLATE 100 MG PO CAPS: 100.0000 mg | ORAL_CAPSULE | Freq: Two times a day (BID) | ORAL | 0 refills | Status: DC

## 2017-05-09 NOTE — ED Provider Notes (Signed)
CSN: [id number]     Arrival date & time 05/09/17  1542 History   First MD Initiated Contact with Patient 05/09/17 1612     Chief Complaint  Patient presents with  . Finger Injury   (Consider location/radiation/quality/duration/timing/severity/associated sxs/prior Treatment) HPI  Brett Hall is a 46 y.o. male presenting to UC with c/o 2-3 days of gradually worsening Right index finger pain.  He reports jamming it on something in the center console of his car.  He is unsure if something is stuck in the tip of his finger where it is most sore, 3/10.  Denies bleeding, discharge or redness to finger. Denies fever or chills. Pt is Right hand dominant.    Past Medical History:  Diagnosis Date  . Chest pain, atypical   . Femoral hernia   . GERD (gastroesophageal reflux disease)   . RBBB (right bundle branch block with left anterior fascicular block)    hemblock   Past Surgical History:  Procedure Laterality Date  . none     Family History  Problem Relation Age of Onset  . Coronary artery disease Unknown        No family history of premature CAD   Social History  Substance Use Topics  . Smoking status: Never Smoker  . Smokeless tobacco: Not on file  . Alcohol use No     Comment: occasiona    Review of Systems  Musculoskeletal: Negative for arthralgias, joint swelling and myalgias.  Skin: Positive for color change. Negative for wound.       Soreness at tip of Left index finger  Neurological: Negative for weakness and numbness.    Allergies  Citric acid  Home Medications   Prior to Admission medications   Medication Sig Start Date End Date Taking? Authorizing Provider  benzonatate (TESSALON) 200 MG capsule Take 1 capsule (200 mg total) by mouth 3 (three) times daily as needed for cough. 11/21/16   Rodolph Bong, MD  ciprofloxacin (CIPRO) 500 MG tablet Take 1 tablet (500 mg total) by mouth 2 (two) times daily. 11/21/16   Rodolph Bong, MD  doxycycline (VIBRAMYCIN) 100 MG  capsule Take 1 capsule (100 mg total) by mouth 2 (two) times daily. One po bid x 7 days 05/09/17   Junius Finner, PA-C  predniSONE (DELTASONE) 10 MG tablet Take 3 tablets (30 mg total) by mouth daily with breakfast. 11/21/16   Rodolph Bong, MD   Meds Ordered and Administered this Visit  Medications - No data to display  BP 129/80 (BP Location: Right Arm)   Pulse 76   Temp 97.6 F (36.4 C) (Oral)   Ht 5\' 11"  (1.803 m)   Wt 193 lb (87.5 kg)   SpO2 98%   BMI 26.92 kg/m  No data found.   Physical Exam  Constitutional: He is oriented to person, place, and time. He appears well-developed and well-nourished.  HENT:  Head: Normocephalic and atraumatic.  Eyes: EOM are normal.  Neck: Normal range of motion.  Cardiovascular: Normal rate.   Pulmonary/Chest: Effort normal.  Musculoskeletal: Normal range of motion. He exhibits tenderness. He exhibits no edema.  Right index finger: full ROM, tenderness to distal volar aspect (see skin exam)  Neurological: He is alert and oriented to person, place, and time.  Skin: Skin is warm and dry.  Left index finger, distal volar aspect: yellowed callused skin with pinpoint darkened area, questionable foreign body.  Psychiatric: He has a normal mood and affect. His behavior is normal.  Nursing note and vitals reviewed.   Urgent Care Course     .Foreign Body Removal Date/Time: 05/09/2017 7:01 PM Performed by: Junius Finner'MALLEY, Ninamarie Keel Authorized by: Donna ChristenBEESE, STEPHEN A  Consent: Verbal consent obtained. Written consent not obtained. Risks and benefits: risks, benefits and alternatives were discussed Consent given by: patient Required items: required blood products, implants, devices, and special equipment available Patient identity confirmed: verbally with patient Body area: skin General location: upper extremity Location details: right index finger Anesthesia: local infiltration  Anesthesia: Local Anesthetic: lidocaine 1% without epinephrine Anesthetic  total: 1 mL  Sedation: Patient sedated: no Patient restrained: no Patient cooperative: yes Localization method: visualized Removal mechanism: forceps (18gtt needle) Dressing: antibiotic ointment and dressing applied Tendon involvement: superficial Depth: subcutaneous Complexity: simple 1 objects recovered. Objects recovered: plastic chip? Post-procedure assessment: foreign body removed Patient tolerance: Patient tolerated the procedure well with no immediate complications Comments: After removal of plastic chip, small amount of yellow pus gently massaged out of the puncture wound. Wound rinsed with saline, bacitracin and bandage applied.     Labs Review Labs Reviewed - No data to display  Imaging Review Dg Hand Complete Right  Result Date: 05/09/2017 CLINICAL DATA:  Blow to the right index finger 3 days ago. The patient is unsure what struck him. Pain. Initial encounter. EXAM: RIGHT HAND - COMPLETE 3+ VIEW COMPARISON:  None. FINDINGS: Remote healed fifth metacarpal fracture noted. Imaged bones, joints and soft tissues are otherwise unremarkable. IMPRESSION: No acute abnormality or finding to explain the patient's symptoms. Remote fifth metacarpal fracture. Electronically Signed   By: Drusilla Kannerhomas  Dalessio M.D.   On: 05/09/2017 16:11     MDM   1. Superficial foreign body of right index finger with infection    FB successfully removed. Pus drained from wound. Will start pt on Doxycycline  Home care instructions provided.     Junius FinnerO'Malley, Tayvien Kane, PA-C 05/09/17 1906

## 2017-05-09 NOTE — Discharge Instructions (Signed)
°  You can soak your finger 2-3 times daily in warm water and epson salt to help get out any additional pus/infection.  Please take antibiotics as prescribed and be sure to complete entire course even if you start to feel better to ensure infection does not come back.

## 2017-05-09 NOTE — ED Triage Notes (Signed)
Pt jammed finger 2-3 days ago.  Now has a painful spot on the tip of his index finger.

## 2017-08-10 ENCOUNTER — Emergency Department (INDEPENDENT_AMBULATORY_CARE_PROVIDER_SITE_OTHER)
Admission: EM | Admit: 2017-08-10 | Discharge: 2017-08-10 | Disposition: A | Discharge status: Home / Self Care | Payer: BLUE CROSS/BLUE SHIELD | Source: Home / Self Care | Attending: Family Medicine | Admitting: Family Medicine

## 2017-08-10 ENCOUNTER — Encounter: Payer: Self-pay | Admitting: *Deleted

## 2017-08-10 DIAGNOSIS — L03011 Cellulitis of right finger: Secondary | ICD-10-CM | POA: Diagnosis not present

## 2017-08-10 MED ORDER — DOXYCYCLINE HYCLATE 100 MG PO CAPS: 100.0000 mg | ORAL_CAPSULE | Freq: Two times a day (BID) | ORAL | 0 refills | Status: DC

## 2017-08-10 MED ORDER — MUPIROCIN 2 % EX OINT: TOPICAL_OINTMENT | CUTANEOUS | 0 refills | Status: DC

## 2017-08-10 MED ORDER — HYDROCODONE-ACETAMINOPHEN 5-325 MG PO TABS: ORAL_TABLET | ORAL | 0 refills | Status: DC

## 2017-08-10 NOTE — ED Triage Notes (Signed)
Patient c/o swelling and pain to his right 4th finger x 1 week. Denies drainage. He denies biting his nails. Also c/o right elbow knot x 3-4 days.

## 2017-08-10 NOTE — Discharge Instructions (Signed)
°  Norco/Vicodin (hydrocodone-acetaminophen) is a narcotic pain medication, do not combine these medications with others containing tylenol. While taking, do not drink alcohol, drive, or perform any other activities that requires focus while taking these medications.   Please take antibiotics as prescribed and be sure to complete entire course even if you start to feel better to ensure infection does not come back.  You may take  acetaminophen every 4-6 hours or in combination with ibuprofen 400-600mg  every 6-8 hours as needed for pain and inflammation.

## 2017-08-10 NOTE — ED Provider Notes (Signed)
[facility]    CSN: [id number] Arrival date & time: 08/10/17  1240     History   Chief Complaint Chief Complaint  Patient presents with  . Cellulitis  . Elbow Pain    HPI Brett Hall is a 46 y.o. male.   HPI  Brett Hall is a 46 y.o. male presenting to UC with c/o 1 week of gradually worsening Right ring finger pain, swelling, and redness at the tip of his finger. He tried ice thinking it would help but no relief.  Pain is aching and throbbing, 9/10. He is Right hand dominant. No known injury.  He also reports noticing a mildly tender nodule to the medial aspect of his elbow.  It is only sore when he pushes on it. Full ROM. No redness or swelling. No fever or chills.     Past Medical History:  Diagnosis Date  . Chest pain, atypical   . Femoral hernia   . GERD (gastroesophageal reflux disease)   . RBBB (right bundle branch block with left anterior fascicular block)    hemblock    Patient Active Problem List   Diagnosis Date Noted  . ED (erectile dysfunction) 08/05/2015  . Enlarged prostate 04/10/2014  . Low back pain 11/22/2012  . Gastroesophageal reflux disease 02/22/2011  . BUNDLE BRANCH BLOCK, RIGHT HEMIBLOCK 02/06/2011    Past Surgical History:  Procedure Laterality Date  . none         Home Medications    Prior to Admission medications   Medication Sig Start Date End Date Taking? Authorizing Provider  doxycycline (VIBRAMYCIN) 100 MG capsule Take 1 capsule (100 mg total) by mouth 2 (two) times daily. One po bid x 7 days 08/10/17   Lurene ShadowPhelps, Saiquan Hands O, PA-C  HYDROcodone-acetaminophen (NORCO/VICODIN) 5-325 MG tablet Take 1 tab every 4-6 hours as needed for pain 08/10/17   Lurene ShadowPhelps, Goble Fudala O, PA-C  mupirocin ointment (BACTROBAN) 2 % Apply to finger 3 times daily for 5 days 08/10/17   Lurene ShadowPhelps, Nechuma Boven O, PA-C    Family History Family History  Problem Relation Age of Onset  . Coronary artery disease Unknown        No family history of premature CAD     Social History Social History  Substance Use Topics  . Smoking status: Never Smoker  . Smokeless tobacco: Never Used  . Alcohol use No     Comment: occasiona     Allergies   Citric acid   Review of Systems Review of Systems  Constitutional: Negative for chills and fever.  Musculoskeletal: Positive for arthralgias, joint swelling and myalgias.  Skin: Positive for color change. Negative for wound.     Physical Exam Triage Vital Signs ED Triage Vitals  Enc Vitals Group     BP 08/10/17 1316 133/87     Pulse Rate 08/10/17 1316 68     Resp --      Temp 08/10/17 1316 98.1 F (36.7 C)     Temp Source 08/10/17 1316 Oral     SpO2 08/10/17 1316 98 %     Weight 08/10/17 1317 190 lb (86.2 kg)     Height --      Head Circumference --      Peak Flow --      Pain Score 08/10/17 1317 9     Pain Loc --      Pain Edu? --      Excl. in GC? --    No data found.  Updated Vital Signs BP 133/87 (BP Location: Left Arm)   Pulse 68   Temp 98.1 F (36.7 C) (Oral)   Wt 190 lb (86.2 kg)   SpO2 98%   BMI 26.50 kg/m   Visual Acuity Right Eye Distance:   Left Eye Distance:   Bilateral Distance:    Right Eye Near:   Left Eye Near:    Bilateral Near:     Physical Exam  Constitutional: He is oriented to person, place, and time. He appears well-developed and well-nourished. No distress.  HENT:  Head: Normocephalic and atraumatic.  Eyes: EOM are normal.  Neck: Normal range of motion.  Cardiovascular: Normal rate.   Pulmonary/Chest: Effort normal.  Musculoskeletal: Normal range of motion. He exhibits edema and tenderness.  Right ring finger: moderate edema. Full ROM. Tenderness.  Right elbow: no obvious edema or deformity. Full ROM. Tenderness to medial aspect with pea-sized palpable nodule.   Neurological: He is alert and oriented to person, place, and time.  Skin: Skin is warm and dry. Capillary refill takes less than 2 seconds. He is not diaphoretic. There is erythema.   Right distal finger finger: moderate edema with erythema, small pustule with surrounding fluctuance around nailbed, worse on ulnar aspect of nail. No active bleeding or drainage.   Psychiatric: He has a normal mood and affect. His behavior is normal.  Nursing note and vitals reviewed.    UC Treatments / Results  Labs (all labs ordered are listed, but only abnormal results are displayed) Labs Reviewed  WOUND CULTURE    EKG  EKG Interpretation None       Radiology No results found.  Procedures .Marland KitchenIncision and Drainage Date/Time: 08/10/2017 2:58 PM Performed by: Lurene Shadow Authorized by: Donna Christen A   Consent:    Consent obtained:  Verbal   Consent given by:  Patient   Risks discussed:  Bleeding, incomplete drainage, infection and pain   Alternatives discussed:  No treatment and delayed treatment Location:    Type:  Abscess   Size:  1   Location:  Upper extremity   Upper extremity location:  Finger   Finger location:  R ring finger Pre-procedure details:    Skin preparation:  Betadine Anesthesia (see MAR for exact dosages):    Anesthesia method:  Topical application and local infiltration   Topical anesthesia: Freeze spray.   Local anesthetic:  Lidocaine 2% w/o epi Procedure type:    Complexity:  Simple Procedure details:    Incision types:  Single straight   Incision depth:  Subcutaneous   Scalpel blade:  11   Wound management:  Irrigated with saline   Drainage:  Bloody and purulent   Drainage amount:  Moderate   Wound treatment:  Wound left open   Packing materials:  None Post-procedure details:    Patient tolerance of procedure:  Tolerated well, no immediate complications   (including critical care time)  Medications Ordered in UC Medications - No data to display   Initial Impression / Assessment and Plan / UC Course  I have reviewed the triage vital signs and the nursing notes.  Pertinent labs & imaging results that were available during  my care of the patient were reviewed by me and considered in my medical decision making (see chart for details).      Final Clinical Impressions(s) / UC Diagnoses   Final diagnoses:  Paronychia of right ring finger   Hx and exam c/w paronychia of Right ring finger. I&D successfully performed. Small  nodule in Right elbow- questionable reactive lymph node vs small cyst F/u with PCP in 4-5 days or return to UC if needed, sooner if significantly worsening.   New Prescriptions Discharge Medication List as of 08/10/2017  1:55 PM    START taking these medications   Details  doxycycline (VIBRAMYCIN) 100 MG capsule Take 1 capsule (100 mg total) by mouth 2 (two) times daily. One po bid x 7 days, Starting Fri 08/10/2017, Normal    HYDROcodone-acetaminophen (NORCO/VICODIN) 5-325 MG tablet Take 1 tab every 4-6 hours as needed for pain, Print    mupirocin ointment (BACTROBAN) 2 % Apply to finger 3 times daily for 5 days, Normal         Controlled Substance Prescriptions [place] Controlled Substance Registry consulted? Yes, I have consulted the [place] Controlled Substances Registry for this patient, and feel the risk/benefit ratio today is favorable for proceeding with this prescription for a controlled substance.   Lurene Shadow, PA-C 08/10/17 1500

## 2017-08-12 ENCOUNTER — Telehealth: Payer: Self-pay | Admitting: Emergency Medicine

## 2017-08-12 NOTE — Telephone Encounter (Signed)
Left a voice mail patient unavailable. Ask that he return a call if he needs any further.

## 2017-08-13 LAB — WOUND CULTURE
MICRO NUMBER:: 80986788
SPECIMEN QUALITY:: ADEQUATE

## 2017-08-23 ENCOUNTER — Ambulatory Visit (INDEPENDENT_AMBULATORY_CARE_PROVIDER_SITE_OTHER): Payer: BLUE CROSS/BLUE SHIELD | Admitting: Family Medicine

## 2017-08-23 ENCOUNTER — Encounter: Payer: Self-pay | Admitting: Family Medicine

## 2017-08-23 VITALS — BP 111/50 | HR 69 | Ht 71.0 in | Wt 189.0 lb

## 2017-08-23 DIAGNOSIS — Z0000 Encounter for general adult medical examination without abnormal findings: Secondary | ICD-10-CM

## 2017-08-23 DIAGNOSIS — M542 Cervicalgia: Secondary | ICD-10-CM

## 2017-08-23 DIAGNOSIS — Z23 Encounter for immunization: Secondary | ICD-10-CM

## 2017-08-23 NOTE — Progress Notes (Signed)
Subjective:    Patient ID: Brett Hall, male    DOB: 1971-09-10, 46 y.o.   MRN: [id number]  HPI 46 year old male with a history of enlarged prostate comes in today for complete physical exam.   C/O right sided neck pain for about 2-3 weeks mostly laterally.  Worse at night when lays dow.  + stiffness in AM.  Mostly sleeps on his back.  No trauma or injury. Works out regularly so not sure if strained the muscle. No numbness or tingling in both arms.   Review of Systems  comprehenseive ROS is negative.   BP (!) 111/50   Pulse 69   Ht  (1.803 m)   Wt 189 lb (85.7 kg)   SpO2 99%   BMI 26.36 kg/m     Allergies  Allergen Reactions  . Citric Acid     Past Medical History:  Diagnosis Date  . Chest pain, atypical   . Femoral hernia   . GERD (gastroesophageal reflux disease)   . RBBB (right bundle branch block with left anterior fascicular block)    hemblock    Past Surgical History:  Procedure Laterality Date  . none      Social History   Social History  . Marital status: Single    Spouse name: N/A  . Number of children: N/A  . Years of education: N/A   Occupational History  . Realtor    Social History Main Topics  . Smoking status: Never Smoker  . Smokeless tobacco: Never Used  . Alcohol use No     Comment: occasiona  . Drug use: No  . Sexual activity: Not on file   Other Topics Concern  . Not on file   Social History Narrative  . No narrative on file    Family History  Problem Relation Age of Onset  . Coronary artery disease Unknown        No family history of premature CAD    Outpatient Encounter Prescriptions as of 08/23/2017  Medication Sig  . [DISCONTINUED] doxycycline (VIBRAMYCIN) 100 MG capsule Take 1 capsule (100 mg total) by mouth 2 (two) times daily. One po bid x 7 days  . [DISCONTINUED] HYDROcodone-acetaminophen (NORCO/VICODIN) 5-325 MG tablet Take 1 tab every 4-6 hours as needed for pain  . [DISCONTINUED] mupirocin ointment  (BACTROBAN) 2 % Apply to finger 3 times daily for 5 days   No facility-administered encounter medications on file as of 08/23/2017.           Objective:   Physical Exam  Constitutional: He is oriented to person, place, and time. He appears well-developed and well-nourished.  HENT:  Head: Normocephalic and atraumatic.  Right Ear: External ear normal.  Left Ear: External ear normal.  Nose: Nose normal.  Mouth/Throat: Oropharynx is clear and moist.  Eyes: Pupils are equal, round, and reactive to light. Conjunctivae and EOM are normal.  Neck: Normal range of motion. Neck supple. No thyromegaly present.  Cardiovascular: Normal rate, regular rhythm, normal heart sounds and intact distal pulses.   Pulmonary/Chest: Effort normal and breath sounds normal.  Abdominal: Soft. Bowel sounds are normal. He exhibits no distension and no mass. There is no tenderness. There is no rebound and no guarding.  Musculoskeletal: Normal range of motion.  Lymphadenopathy:    He has no cervical adenopathy.  Neurological: He is alert and oriented to person, place, and time. He has normal reflexes.  Skin: Skin is warm and dry.  Psychiatric: He has a normal mood  and affect. His behavior is normal. Judgment and thought content normal.   Neck with Normal flexion, extension, rotation right and left and side bending.  nontender over the cervical spine.  nontender over the paraspinous muscles.  Shoulders with NROM.        Assessment & Plan:  cpe Keep up a regular exercise program and make sure you are eating a healthy diet Try to eat 4 servings of dairy a day, or if you are lactose intolerant take a calcium with vitamin D daily.  Your vaccines are up to date.   Right sided neck pain - MSK strain. Work on stretches, heat and recommend massage. Call if not improving.    He declined flu vaccine today.

## 2017-08-23 NOTE — Patient Instructions (Addendum)

## 2017-09-18 ENCOUNTER — Emergency Department (INDEPENDENT_AMBULATORY_CARE_PROVIDER_SITE_OTHER)
Admission: EM | Admit: 2017-09-18 | Discharge: 2017-09-18 | Disposition: A | Discharge status: Home / Self Care | Payer: BLUE CROSS/BLUE SHIELD | Source: Home / Self Care | Attending: Family Medicine | Admitting: Family Medicine

## 2017-09-18 ENCOUNTER — Encounter: Payer: Self-pay | Admitting: *Deleted

## 2017-09-18 DIAGNOSIS — M542 Cervicalgia: Secondary | ICD-10-CM

## 2017-09-18 DIAGNOSIS — R1030 Lower abdominal pain, unspecified: Secondary | ICD-10-CM | POA: Diagnosis not present

## 2017-09-18 DIAGNOSIS — S161XXA Strain of muscle, fascia and tendon at neck level, initial encounter: Secondary | ICD-10-CM

## 2017-09-18 MED ORDER — CYCLOBENZAPRINE HCL 5 MG PO TABS: 5.0000 mg | ORAL_TABLET | Freq: Two times a day (BID) | ORAL | 0 refills | Status: DC | PRN

## 2017-09-18 NOTE — ED Provider Notes (Signed)
[facility]    CSN: [id number] Arrival date & time: 09/18/17  1355     History   Chief Complaint Chief Complaint  Patient presents with  . Groin Pain    HPI Brett Hall is a 46 y.o. male.   HPI  Brett Hall is a 46 y.o. male presenting to UC with c/o chronic intermittent lower abdominal pain that is sharp in nature, only lasts a few seconds at a time but it has been ongoing for 4-5 years.  He has seen his PCP as well as a urologist w/o a dx.  Denies change in BMs, dysuria, penile discharge or scrotal swelling. No hx of abdominal surgeries.  He does have unprotected intercourse and would like to be tested for STIs but has not specific known exposure to an STI.  Denies fever, chills, n/v/d.  Pt also c/o 6 weeks of intermittent Right side neck pain and stiffness that is worse in the morning when he wakes up.  He saw his PCP for same about 1 month ago but no relief of symptoms over time.  He has not tried any Tylenol or Motrin for pain.  Denies specific known injury. Denies radiation of pain into back, arms or legs.    Past Medical History:  Diagnosis Date  . Chest pain, atypical   . Femoral hernia   . GERD (gastroesophageal reflux disease)   . RBBB (right bundle branch block with left anterior fascicular block)    hemblock    Patient Active Problem List   Diagnosis Date Noted  . ED (erectile dysfunction) 08/05/2015  . Enlarged prostate 04/10/2014  . Low back pain 11/22/2012  . Gastroesophageal reflux disease 02/22/2011  . BUNDLE BRANCH BLOCK, RIGHT HEMIBLOCK 02/06/2011    Past Surgical History:  Procedure Laterality Date  . none         Home Medications    Prior to Admission medications   Medication Sig Start Date End Date Taking? Authorizing Provider  cyclobenzaprine (FLEXERIL) 5 MG tablet Take 1-2 tablets (5-10 mg total) by mouth 2 (two) times daily as needed for muscle spasms. 09/18/17   Lurene Shadow, PA-C    Family History Family  History  Problem Relation Age of Onset  . Coronary artery disease Unknown        No family history of premature CAD    Social History Social History  Substance Use Topics  . Smoking status: Never Smoker  . Smokeless tobacco: Never Used  . Alcohol use No     Comment: occasiona     Allergies   Citric acid   Review of Systems Review of Systems  Constitutional: Negative for chills and fever.  Gastrointestinal: Positive for abdominal pain (lower). Negative for diarrhea, nausea and vomiting.  Genitourinary: Negative for decreased urine volume, discharge, dysuria, flank pain, frequency, hematuria, penile swelling, scrotal swelling, testicular pain and urgency.  Musculoskeletal: Positive for myalgias, neck pain and neck stiffness. Negative for back pain.  Skin: Negative for color change and rash.  Neurological: Negative for weakness and numbness.     Physical Exam Triage Vital Signs ED Triage Vitals  Enc Vitals Group     BP      Pulse      Resp      Temp      Temp src      SpO2      Weight      Height      Head Circumference      Peak  Flow      Pain Score      Pain Loc      Pain Edu?      Excl. in GC?    No data found.   Updated Vital Signs BP 126/77 (BP Location: Left Arm)   Pulse 63   Temp 97.6 F (36.4 C) (Oral)   Resp 16   Ht  (1.803 m)   Wt 193 lb (87.5 kg)   SpO2 99%   BMI 26.92 kg/m   Visual Acuity Right Eye Distance:   Left Eye Distance:   Bilateral Distance:    Right Eye Near:   Left Eye Near:    Bilateral Near:     Physical Exam  Constitutional: He is oriented to person, place, and time. He appears well-developed and well-nourished. No distress.  HENT:  Head: Normocephalic and atraumatic.  Eyes: EOM are normal.  Neck: Normal range of motion. Neck supple.  No midline spinal tenderness. Mild Right side cervical muscle tenderness. Full ROM  Cardiovascular: Normal rate and regular rhythm.   Pulses:      Radial pulses are 2+ on the  right side, and 2+ on the left side.  Pulmonary/Chest: Effort normal and breath sounds normal. No stridor. No respiratory distress. He has no wheezes.  Abdominal: Soft. Bowel sounds are normal. He exhibits no distension and no mass. There is no tenderness. There is no rebound, no guarding and no CVA tenderness. No hernia.  Musculoskeletal: Normal range of motion.  Neurological: He is alert and oriented to person, place, and time.  Skin: Skin is warm and dry. He is not diaphoretic.  Psychiatric: He has a normal mood and affect. His behavior is normal.  Nursing note and vitals reviewed.    UC Treatments / Results  Labs (all labs ordered are listed, but only abnormal results are displayed) Labs Reviewed  GC/CHLAMYDIA PROBE AMP    EKG  EKG Interpretation None       Radiology No results found.  Procedures Procedures (including critical care time)  Medications Ordered in UC Medications - No data to display   Initial Impression / Assessment and Plan / UC Course  I have reviewed the triage vital signs and the nursing notes.  Pertinent labs & imaging results that were available during my care of the patient were reviewed by me and considered in my medical decision making (see chart for details).      Abdominal/Pelvic pain: benign exam.  Urine sent to lab for gonorrhea and chlamydia testing. Will hold off on empiric treatment due to no specific known exposure.  Pt will be notified of results and if treatment indicated.  Neck pain: hx and exam c/w neck muscle strain. Encouraged use of acetaminophen, ibuprofen, ice, heat and may try Flexeril.   F/u with PCP in 1 week if not improving.   Final Clinical Impressions(s) / UC Diagnoses   Final diagnoses:  Neck pain on right side  Strain of neck muscle, initial encounter  Intermittent lower abdominal pain    New Prescriptions Discharge Medication List as of 09/18/2017  2:41 PM    START taking these medications   Details    cyclobenzaprine (FLEXERIL) 5 MG tablet Take 1-2 tablets (5-10 mg total) by mouth 2 (two) times daily as needed for muscle spasms., Starting Tue 09/18/2017, Normal         Controlled Substance Prescriptions [place] Controlled Substance Registry consulted? Not Applicable   Rolla Plate 09/18/17 1458

## 2017-09-18 NOTE — ED Triage Notes (Signed)
Pt c/o intermittent sharp groin pain x 4-5 years. Denies change in BM, dysuria or swelling. He also c/o intermittent sharp RT side neck pain x 6 wks. Denies injury.

## 2017-09-18 NOTE — Discharge Instructions (Signed)
°  You may take 500mg acetaminophen every 4-6 hours or in combination with ibuprofen 400-600mg every 6-8 hours as needed for pain and inflammation. ° °Flexeril (cyclobenzaprine) is a muscle relaxer and may cause drowsiness. Do not drink alcohol, drive, or operate heavy machinery while taking. ° ° °

## 2017-09-19 ENCOUNTER — Telehealth: Payer: Self-pay | Admitting: *Deleted

## 2017-09-19 LAB — C. TRACHOMATIS/N. GONORRHOEAE RNA
C. trachomatis RNA, TMA: NOT DETECTED
N. gonorrhoeae RNA, TMA: NOT DETECTED

## 2017-09-19 NOTE — Telephone Encounter (Signed)
Patient gave password "Hilda LiasMarie". Negative labs given and discussed. Encouraged follow up with PCP and possibly urologist given he is still having the pain. He has not filled the flexreril yet.

## 2018-04-12 ENCOUNTER — Encounter: Payer: Self-pay | Admitting: Family Medicine

## 2018-04-12 ENCOUNTER — Ambulatory Visit (INDEPENDENT_AMBULATORY_CARE_PROVIDER_SITE_OTHER): Payer: BLUE CROSS/BLUE SHIELD | Admitting: Family Medicine

## 2018-04-12 VITALS — BP 131/82 | HR 85 | Ht 71.0 in | Wt 192.0 lb

## 2018-04-12 DIAGNOSIS — Z13220 Encounter for screening for lipoid disorders: Secondary | ICD-10-CM

## 2018-04-12 DIAGNOSIS — M545 Low back pain, unspecified: Secondary | ICD-10-CM

## 2018-04-12 DIAGNOSIS — Z833 Family history of diabetes mellitus: Secondary | ICD-10-CM

## 2018-04-12 DIAGNOSIS — G8929 Other chronic pain: Secondary | ICD-10-CM

## 2018-04-12 DIAGNOSIS — R102 Pelvic and perineal pain: Secondary | ICD-10-CM

## 2018-04-12 DIAGNOSIS — N400 Enlarged prostate without lower urinary tract symptoms: Secondary | ICD-10-CM | POA: Diagnosis not present

## 2018-04-12 LAB — POCT URINALYSIS DIPSTICK
Bilirubin, UA: NEGATIVE
Blood, UA: NEGATIVE
GLUCOSE UA: NEGATIVE
KETONES UA: NEGATIVE
LEUKOCYTES UA: NEGATIVE
Nitrite, UA: NEGATIVE
Protein, UA: NEGATIVE
SPEC GRAV UA: 1.015 (ref 1.010–1.025)
Urobilinogen, UA: 0.2 E.U./dL
pH, UA: 8.5 — AB (ref 5.0–8.0)

## 2018-04-12 NOTE — Progress Notes (Signed)
Subjective:    Patient ID: Brett Hall, male    DOB: 1971-08-10, 47 y.o.   MRN: [id number]  HPI 47 yo with hx of BPH and chronic prostadynia comes in today complaining of pain in his low back just above the crease of the buttock area that radiates come down to his anus.  He feels like it is deeper inside.  He said problems with pelvic pain and discomfort on and off for several years and he seen urology a couple of times.  He usually just tries to do warm baths when it is bothering him.  He reports that his bowel movements move regularly.  He has been told he has a mildly enlarged prostate as well.  He is not currently on medications and denies any significant urinary symptoms such as frequency, urgency, difficulty stopping and starting urine stream, or frequency at nighttime.  He denies any problems with sexual dysfunction or erectile dysfunction.  He does work out regularly at [employer] but has not suffered any specific injuries.  He does wake up with some tightness and soreness in his low back in the morning and says he just tries to stretch it out.  He is noticed that as he is gotten older.  Over the last 6 months ago he is also had some intermittent rectal itching but is not daily and is not frequent.  He just was not sure what could be causing this.  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  But he has not pursued that.  He also has a family history of diabetes and would like to be tested.  No new sexual partners.  Still with his girlfriend that he has been with for quite some time.  He wants to know if there is anything new he can do that would help with his prostate health  He report that he had a sharp pain in his right butt after intercourse. Says it felt similar to when hit your elbow/funny bone. Only lasted a couple of minutes.   Review of  Systems  BP 131/82   Pulse 85   Ht  (1.803 m)   Wt 192 lb (87.1 kg)   SpO2 100%   BMI 26.78 kg/m     Allergies  Allergen Reactions  . Citric Acid     Past Medical History:  Diagnosis Date  . Chest pain, atypical   . Femoral hernia   . GERD (gastroesophageal reflux disease)   . RBBB (right bundle branch block with left anterior fascicular block)    hemblock    Past Surgical History:  Procedure Laterality Date  . none      Social History   Socioeconomic History  . Marital status: Married    Spouse name: Not on file  . Number of children: Not on file  . Years of education: Not on file  . Highest education level: Not on file  Occupational History  . Occupation: Veterinary surgeon  Social Needs  . Financial resource strain: Not on file  . Food insecurity:    Worry: Not on file    Inability: Not on file  . Transportation needs:    Medical: Not on file    Non-medical: Not on file  Tobacco Use  . Smoking status: Never Smoker  . Smokeless tobacco: Never Used  Substance and Sexual Activity  .  Alcohol use: No    Alcohol/week: 0.0 oz    Comment: occasiona  . Drug use: No  . Sexual activity: Not on file  Lifestyle  . Physical activity:    Days per week: Not on file    Minutes per session: Not on file  . Stress: Not on file  Relationships  . Social connections:    Talks on phone: Not on file    Gets together: Not on file    Attends religious service: Not on file    Active member of club or organization: Not on file    Attends meetings of clubs or organizations: Not on file    Relationship status: Not on file  . Intimate partner violence:    Fear of current or ex partner: Not on file    Emotionally abused: Not on file    Physically abused: Not on file    Forced sexual activity: Not on file  Other Topics Concern  . Not on file  Social History Narrative  . Not on file    Family History  Problem Relation Age of Onset  . Coronary artery disease Unknown         No family history of premature CAD  . Diabetes Unknown     Outpatient Encounter Medications as of 04/12/2018  Medication Sig  . [DISCONTINUED] cyclobenzaprine (FLEXERIL) 5 MG tablet Take 1-2 tablets (5-10 mg total) by mouth 2 (two) times daily as needed for muscle spasms.   No facility-administered encounter medications on file as of 04/12/2018.          Objective:   Physical Exam  Constitutional: He is oriented to person, place, and time. He appears well-developed and well-nourished.  HENT:  Head: Normocephalic and atraumatic.  Eyes: Conjunctivae and EOM are normal.  Cardiovascular: Normal rate.  Pulmonary/Chest: Effort normal.  Musculoskeletal:  On lumbar flexion, oxygen, rotation right left and side bending.  Nontender over the lumbar spine and sacrum.  Neurological: He is alert and oriented to person, place, and time.  Skin: Skin is dry. No pallor.  Psychiatric: He has a normal mood and affect. His behavior is normal.  Vitals reviewed.      Assessment & Plan:  BPH with prostadynia - gave advice about trying Saw Palmetto. He is really not having a lot of urinary sxs right now so he really is not a candidate for prescription medication I do not think is really can help with the discomfort and pressure that he gets.  Rectal pain-this may be related but it may not be.  He may actually be getting some irritation or may even have some internal hemorrhoids that are causing a problem.  He says soaking the bathtubs does seem to help.  He occasionally gets rectal itching but is not persistent.  Encouraged him to start using the flushable wipes after bowel movement to just clean really well.  Low back pain-it sounds like what he is describing is a little deeper than just the lumbar spine or sacrum though he does get some tightness across his low back when he first gets up in the morning.  For now it is not very bothersome so will not pursue further work-up but if continues or persist or  gets worse than we can always do some imaging of the lumbar spine if needed.  fam hx of DM - check A1C  Due for lipid screening.

## 2018-04-12 NOTE — Patient Instructions (Addendum)
Try the [employer] herb for your prostate.   Saw Palmetto, Serenoa repens tablets and capsules What is this medicine? SAW PALMETTO (saw pal MET oh) is an herbal or dietary supplement. It is promoted to help support prostate health. The FDA has not approved this supplement for any medical use. This supplement may be used for other purposes; ask your health care provider or pharmacist if you have questions. This medicine may be used for other purposes; ask your health care provider or pharmacist if you have questions. What should I tell my health care provider before I take this medicine? They need to know if you have any of these conditions: -liver disease -prostate cancer -an unusual or allergic reaction to saw palmetto, other herbs, plants, medicines, foods, dyes, or preservatives -pregnant or trying to get pregnant -breast-feeding How should I use this medicine? Take this supplement by mouth with a glass of water. Follow the directions on the package labeling, or take as directed by your health care professional. If this supplement upsets your stomach, take it with food. Do not take this supplement more often than directed. Contact your pediatrician regarding the use of this supplement in children. Special care may be needed. Overdosage: If you think you have taken too much of this medicine contact a poison control center or emergency room at once. NOTE: This medicine is only for you. Do not share this medicine with others. What if I miss a dose? If you miss a dose, take it as soon as you can. If it is almost time for your next dose, take only that dose. Do not take double or extra doses. What may interact with this medicine? -other medicines for the prostate like finasteride, tamsulosin -hormones -warfarin This list may not describe all possible interactions. Give your health care provider a list of all the medicines, herbs, non-prescription drugs, or dietary supplements you use. Also  tell them if you smoke, drink alcohol, or use illegal drugs. Some items may interact with your medicine. What should I watch for while using this medicine? Tell your doctor or healthcare professional if your symptoms do not start to get better or if they get worse. If you are scheduled for any medical or dental procedure, tell your healthcare provider that you are taking this supplement. You may need to stop taking this supplement before the procedure. Herbal or dietary supplements are not regulated like medicines. Rigid quality control standards are not required for dietary supplements. The purity and strength of these products can vary. The safety and effect of this dietary supplement for a certain disease or illness is not well known. This product is not intended to diagnose, treat, cure or prevent any disease. The Food and Drug Administration suggests the following to help consumers protect themselves: -Always read product labels and follow directions. -Natural does not mean a product is safe for humans to take. -Look for products that include USP after the ingredient name. This means that the manufacturer followed the standards of the [place] Pharmacopoeia. -Supplements made or sold by a nationally known food or drug company are more likely to be made under tight controls. You can write to the company for more information about how the product was made. What side effects may I notice from receiving this medicine? Side effects that you should report to your doctor or health care professional as soon as possible: -allergic reactions like skin rash, itching or hives, swelling of the face, lips, or tongue -breathing problems -dark urine -  fever -general ill feeling or flu-like symptoms -light-colored stools -loss of appetite, nausea -right upper belly pain -trouble passing urine or change in the amount of urine -unusually weak or tired -yellowing of the eyes or skin Side effects that usually do not  require medical attention (report to your doctor or health care professional if they continue or are bothersome): -breast tenderness or enlargement -diarrhea -headache -stomach upset This list may not describe all possible side effects. Call your doctor for medical advice about side effects. You may report side effects to FDA at 1-800-FDA-1088. Where should I keep my medicine? Keep out of the reach of children. Store at room temperature between 15 and 30 degrees C (59 and 86 degrees F) or as directed on the package label. Protect from moisture. Throw away any unused supplement after the expiration date. NOTE: This sheet is a summary. It may not cover all possible information. If you have questions about this medicine, talk to your doctor, pharmacist, or health care provider.  2018 Elsevier/Gold Standard (2008-07-23 15:15:00)  

## 2018-05-13 DIAGNOSIS — R102 Pelvic and perineal pain: Secondary | ICD-10-CM | POA: Diagnosis not present

## 2018-05-13 DIAGNOSIS — Z13220 Encounter for screening for lipoid disorders: Secondary | ICD-10-CM | POA: Diagnosis not present

## 2018-05-13 DIAGNOSIS — N400 Enlarged prostate without lower urinary tract symptoms: Secondary | ICD-10-CM | POA: Diagnosis not present

## 2018-05-13 DIAGNOSIS — Z833 Family history of diabetes mellitus: Secondary | ICD-10-CM | POA: Diagnosis not present

## 2018-05-14 ENCOUNTER — Telehealth: Payer: Self-pay | Admitting: Family Medicine

## 2018-05-14 ENCOUNTER — Ambulatory Visit (INDEPENDENT_AMBULATORY_CARE_PROVIDER_SITE_OTHER): Payer: BLUE CROSS/BLUE SHIELD | Admitting: Family Medicine

## 2018-05-14 VITALS — BP 118/74 | HR 86 | Ht 71.0 in | Wt 194.0 lb

## 2018-05-14 DIAGNOSIS — R7301 Impaired fasting glucose: Secondary | ICD-10-CM | POA: Diagnosis not present

## 2018-05-14 DIAGNOSIS — N400 Enlarged prostate without lower urinary tract symptoms: Secondary | ICD-10-CM

## 2018-05-14 DIAGNOSIS — Z0000 Encounter for general adult medical examination without abnormal findings: Secondary | ICD-10-CM

## 2018-05-14 LAB — COMPLETE METABOLIC PANEL WITH GFR
AG Ratio: 1.4 (calc) (ref 1.0–2.5)
ALT: 20 U/L (ref 9–46)
AST: 27 U/L (ref 10–40)
Albumin: 4.3 g/dL (ref 3.6–5.1)
Alkaline phosphatase (APISO): 54 U/L (ref 40–115)
BUN: 14 mg/dL (ref 7–25)
CALCIUM: 9.6 mg/dL (ref 8.6–10.3)
CO2: 28 mmol/L (ref 20–32)
CREATININE: 1.22 mg/dL (ref 0.60–1.35)
Chloride: 101 mmol/L (ref 98–110)
GFR, EST NON AFRICAN AMERICAN: 72 mL/min/{1.73_m2} (ref >=60)
GFR, Est African American: 83 mL/min/{1.73_m2} (ref >=60)
GLOBULIN: 3 g/dL (ref 1.9–3.7)
Glucose, Bld: 106 mg/dL — ABNORMAL HIGH (ref 65–99)
Potassium: 4.2 mmol/L (ref 3.5–5.3)
SODIUM: 138 mmol/L (ref 135–146)
TOTAL PROTEIN: 7.3 g/dL (ref 6.1–8.1)
Total Bilirubin: 0.4 mg/dL (ref 0.2–1.2)

## 2018-05-14 LAB — CBC
HCT: 41.2 % (ref 38.5–50.0)
Hemoglobin: 13.9 g/dL (ref 13.2–17.1)
MCH: 29.4 pg (ref 27.0–33.0)
MCHC: 33.7 g/dL (ref 32.0–36.0)
MCV: 87.3 fL (ref 80.0–100.0)
MPV: 11.7 fL (ref 7.5–12.5)
PLATELETS: 161 10*3/uL (ref 140–400)
RBC: 4.72 10*6/uL (ref 4.20–5.80)
RDW: 12.7 % (ref 11.0–15.0)
WBC: 4.1 10*3/uL (ref 3.8–10.8)

## 2018-05-14 LAB — HEMOGLOBIN A1C
Hgb A1c MFr Bld: 5.8 % of total Hgb — ABNORMAL HIGH (ref <5.7)
Mean Plasma Glucose: 120 (calc)
eAG (mmol/L): 6.6 (calc)

## 2018-05-14 LAB — LIPID PANEL
CHOL/HDL RATIO: 3.7 (calc) (ref <5.0)
Cholesterol: 193 mg/dL (ref <200)
HDL: 52 mg/dL (ref >40)
LDL Cholesterol (Calc): 114 mg/dL (calc) — ABNORMAL HIGH
NON-HDL CHOLESTEROL (CALC): 141 mg/dL — AB (ref <130)
TRIGLYCERIDES: 155 mg/dL — AB (ref <150)

## 2018-05-14 LAB — PSA: PSA: 0.8 ng/mL (ref <=4.0)

## 2018-05-14 NOTE — Telephone Encounter (Signed)
Call pt: I was unable to find the ultrasound report.  So it must of been the urologist that had ordered it.  Some to go ahead and place referral back to them and let them do additional work-up.  If it is negative then we can always take a look at his lumbar spine and see if that may be causing some of his symptoms.

## 2018-05-14 NOTE — Progress Notes (Signed)
Subjective:    Patient ID: Brett Hall, male    DOB: 1971-10-19, 47 y.o.   MRN: [id number]  HPI 47 year old male is here today for complete physical exam.  He did go for his lab work earlier this week.   He still continues to have some pelvic pressure and discomfort.  This is not painful to keep him from doing anything but just gets uncomfortable.  He does have some pain in his low back but then gets discomfort around the anal area.  To the point where he will have to come to move around.  He had discussed this when he came in previously but feels like it is becoming more bothersome and more frequent.  Soaking in a warm bath is helpful.  Previously he was doing that about once a month but now he is needing to do at a most every other day.  He felt reassured that his PSA level was normal today.  I did review all of his labs with him including an abnormal hemoglobin A1c today.  Review of Systems  Comprehensive ROS is negative.   BP 118/74   Pulse 86   Ht 5\' 11"  (1.803 m)   Wt 194 lb (88 kg)   SpO2 100%   BMI 27.06 kg/m     Allergies  Allergen Reactions  . Citric Acid     Past Medical History:  Diagnosis Date  . Chest pain, atypical   . Femoral hernia   . GERD (gastroesophageal reflux disease)   . RBBB (right bundle branch block with left anterior fascicular block)    hemblock    Past Surgical History:  Procedure Laterality Date  . none      Social History   Socioeconomic History  . Marital status: Married    Spouse name: Not on file  . Number of children: Not on file  . Years of education: Not on file  . Highest education level: Not on file  Occupational History  . Occupation: Veterinary surgeonealtor  Social Needs  . Financial resource strain: Not on file  . Food insecurity:    Worry: Not on file    Inability: Not on file  . Transportation needs:    Medical: Not on file    Non-medical: Not on file  Tobacco Use  . Smoking status: Never Smoker  . Smokeless tobacco:  Never Used  Substance and Sexual Activity  . Alcohol use: No    Alcohol/week: 0.0 oz    Comment: occasiona  . Drug use: No  . Sexual activity: Not on file  Lifestyle  . Physical activity:    Days per week: Not on file    Minutes per session: Not on file  . Stress: Not on file  Relationships  . Social connections:    Talks on phone: Not on file    Gets together: Not on file    Attends religious service: Not on file    Active member of club or organization: Not on file    Attends meetings of clubs or organizations: Not on file    Relationship status: Not on file  . Intimate partner violence:    Fear of current or ex partner: Not on file    Emotionally abused: Not on file    Physically abused: Not on file    Forced sexual activity: Not on file  Other Topics Concern  . Not on file  Social History Narrative  . Not on file    Family History  Problem Relation Age of Onset  . Coronary artery disease Unknown        No family history of premature CAD  . Diabetes Unknown     No outpatient encounter medications on file as of 05/14/2018.   No facility-administered encounter medications on file as of 05/14/2018.           Objective:   Physical Exam  Constitutional: He is oriented to person, place, and time. He appears well-developed and well-nourished.  HENT:  Head: Normocephalic and atraumatic.  Right Ear: External ear normal.  Left Ear: External ear normal.  Nose: Nose normal.  Mouth/Throat: Oropharynx is clear and moist.  Eyes: Pupils are equal, round, and reactive to light. Conjunctivae and EOM are normal.  Neck: Normal range of motion. Neck supple. No thyromegaly present.  Cardiovascular: Normal rate, regular rhythm, normal heart sounds and intact distal pulses.  Pulmonary/Chest: Effort normal and breath sounds normal.  Abdominal: Soft. Bowel sounds are normal. He exhibits no distension and no mass. There is no tenderness. There is no rebound and no guarding.   Musculoskeletal: Normal range of motion.  Lymphadenopathy:    He has no cervical adenopathy.  Neurological: He is alert and oriented to person, place, and time. He has normal reflexes.  Skin: Skin is warm and dry.  Psychiatric: He has a normal mood and affect. His behavior is normal. Judgment and thought content normal.       Assessment & Plan:  CPE -  Keep up a regular exercise program and make sure you are eating a healthy diet Try to eat 4 servings of dairy a day, or if you are lactose intolerant take a calcium with vitamin D daily.  Your vaccines are up to date.    IFG - new dx. discussed working on healthy diet and regular exercise which she Artie does pretty good with.  Just watch portion control on carbohydrates and plan to recheck in 6 months.  Pelvic and anal discomfort-do recommend that we get him back in with urology.  He felt like he had an ultrasound in the past.  I do think this could be helpful in quantifying the volume of the prostate gland to see if things have changed or has enlarged.  He still denies any significant urinary symptoms.  I was unable to find this exam so we will plan to refer back to urology.  If it is not coming from his prostate and consider that it could be coming from his low back.

## 2018-05-14 NOTE — Patient Instructions (Addendum)
We will plan to recheck your hemoglobin A1c, which monitors her blood sugars in about 6 months.   Health Maintenance, Male A healthy lifestyle and preventive care is important for your health and wellness. Ask your health care provider about what schedule of regular examinations is right for you. What should I know about weight and diet? Eat a Healthy Diet  Eat plenty of vegetables, fruits, whole grains, low-fat dairy products, and lean protein.  Do not eat a lot of foods high in solid fats, added sugars, or salt.  Maintain a Healthy Weight Regular exercise can help you achieve or maintain a healthy weight. You should:  Do at least 150 minutes of exercise each week. The exercise should increase your heart rate and make you sweat (moderate-intensity exercise).  Do strength-training exercises at least twice a week.  Watch Your Levels of Cholesterol and Blood Lipids  Have your blood tested for lipids and cholesterol every 5 years starting at 45 years of age. If you are at high risk for heart disease, you should start having your blood tested when you are 45 years old. You may need to have your cholesterol levels checked more often if: ? Your lipid or cholesterol levels are high. ? You are older than 45 years of age. ? You are at high risk for heart disease.  What should I know about cancer screening? Many types of cancers can be detected early and may often be prevented. Lung Cancer  You should be screened every year for lung cancer if: ? You are a current smoker who has smoked for at least 30 years. ? You are a former smoker who has quit within the past 15 years.  Talk to your health care provider about your screening options, when you should start screening, and how often you should be screened.  Colorectal Cancer  Routine colorectal cancer screening usually begins at 45 years of age and should be repeated every 5-10 years until you are 45 years old. You may need to be screened  more often if early forms of precancerous polyps or small growths are found. Your health care provider may recommend screening at an earlier age if you have risk factors for colon cancer.  Your health care provider may recommend using home test kits to check for hidden blood in the stool.  A small camera at the end of a tube can be used to examine your colon (sigmoidoscopy or colonoscopy). This checks for the earliest forms of colorectal cancer.  Prostate and Testicular Cancer  Depending on your age and overall health, your health care provider may do certain tests to screen for prostate and testicular cancer.  Talk to your health care provider about any symptoms or concerns you have about testicular or prostate cancer.  Skin Cancer  Check your skin from head to toe regularly.  Tell your health care provider about any new moles or changes in moles, especially if: ? There is a change in a mole's size, shape, or color. ? You have a mole that is larger than a pencil eraser.  Always use sunscreen. Apply sunscreen liberally and repeat throughout the day.  Protect yourself by wearing long sleeves, pants, a wide-brimmed hat, and sunglasses when outside.  What should I know about heart disease, diabetes, and high blood pressure?  If you are 6318-45 years of age, have your blood pressure checked every 3-5 years. If you are 45 years of age or older, have your blood pressure checked every year.  You should have your blood pressure measured twice-once when you are at a hospital or clinic, and once when you are not at a hospital or clinic. Record the average of the two measurements. To check your blood pressure when you are not at a hospital or clinic, you can use: ? An automated blood pressure machine at a pharmacy. ? A home blood pressure monitor.  Talk to your health care provider about your target blood pressure.  If you are between 79-24 years old, ask your health care provider if you should  take aspirin to prevent heart disease.  Have regular diabetes screenings by checking your fasting blood sugar level. ? If you are at a normal weight and have a low risk for diabetes, have this test once every three years after the age of 64. ? If you are overweight and have a high risk for diabetes, consider being tested at a younger age or more often.  A one-time screening for abdominal aortic aneurysm (AAA) by ultrasound is recommended for men aged 69-75 years who are current or former smokers. What should I know about preventing infection? Hepatitis B If you have a higher risk for hepatitis B, you should be screened for this virus. Talk with your health care provider to find out if you are at risk for hepatitis B infection. Hepatitis C Blood testing is recommended for:  Everyone born from [birth year] through 1963.  Anyone with known risk factors for hepatitis C.  Sexually Transmitted Diseases (STDs)  You should be screened each year for STDs including gonorrhea and chlamydia if: ? You are sexually active and are younger than 45 years of age. ? You are older than 45 years of age and your health care provider tells you that you are at risk for this type of infection. ? Your sexual activity has changed since you were last screened and you are at an increased risk for chlamydia or gonorrhea. Ask your health care provider if you are at risk.  Talk with your health care provider about whether you are at high risk of being infected with HIV. Your health care provider Eoff recommend a prescription medicine to help prevent HIV infection.  What else can I do?  Schedule regular health, dental, and eye exams.  Stay current with your vaccines (immunizations).  Do not use any tobacco products, such as cigarettes, chewing tobacco, and e-cigarettes. If you need help quitting, ask your health care provider.  Limit alcohol intake to no more than 2 drinks per day. One drink equals 12 ounces of beer, 5  ounces of wine, or 1 ounces of hard liquor.  Do not use street drugs.  Do not share needles.  Ask your health care provider for help if you need support or information about quitting drugs.  Tell your health care provider if you often feel depressed.  Tell your health care provider if you have ever been abused or do not feel safe at home. This information is not intended to replace advice given to you by your health care provider. Make sure you discuss any questions you have with your health care provider. Document Released: 05/18/2008 Document Revised: 07/19/2016 Document Reviewed: 08/24/2015 Elsevier Interactive Patient Education  [employer].

## 2018-05-15 NOTE — Telephone Encounter (Signed)
Attempted to contact Pt, no answer and VM is full.  

## 2018-05-15 NOTE — Telephone Encounter (Signed)
No vm. No answer.Heath GoldBarkley, Astrid Vides [place]etta, CMA

## 2018-05-17 NOTE — Telephone Encounter (Signed)
Pt advised of recommendations. Ok w/referral.Brett Hall, Brett Hall, [place]

## 2018-07-29 DIAGNOSIS — R102 Pelvic and perineal pain: Secondary | ICD-10-CM | POA: Diagnosis not present

## 2018-07-29 DIAGNOSIS — I861 Scrotal varices: Secondary | ICD-10-CM | POA: Diagnosis not present

## 2018-11-18 ENCOUNTER — Ambulatory Visit: Payer: BLUE CROSS/BLUE SHIELD | Admitting: Family Medicine

## 2019-04-09 ENCOUNTER — Encounter: Payer: Self-pay | Admitting: Family Medicine

## 2019-04-09 ENCOUNTER — Ambulatory Visit (INDEPENDENT_AMBULATORY_CARE_PROVIDER_SITE_OTHER): Payer: BLUE CROSS/BLUE SHIELD | Admitting: Family Medicine

## 2019-04-09 VITALS — BP 142/97 | HR 82 | Temp 96.1°F | Ht 71.0 in | Wt 195.1 lb

## 2019-04-09 DIAGNOSIS — Z125 Encounter for screening for malignant neoplasm of prostate: Secondary | ICD-10-CM

## 2019-04-09 DIAGNOSIS — R030 Elevated blood-pressure reading, without diagnosis of hypertension: Secondary | ICD-10-CM | POA: Diagnosis not present

## 2019-04-09 DIAGNOSIS — R6883 Chills (without fever): Secondary | ICD-10-CM | POA: Diagnosis not present

## 2019-04-09 DIAGNOSIS — E7800 Pure hypercholesterolemia, unspecified: Secondary | ICD-10-CM | POA: Diagnosis not present

## 2019-04-09 NOTE — Progress Notes (Signed)
No pain. "I feel myself" its in my legs, it feels internal, like chills, goose bumps. Comes in spurts. No palpitations, or SOB, CP  But he did have some discomfort the other day but this didn't come back.Loralee Pacas [place]

## 2019-04-09 NOTE — Progress Notes (Signed)
Virtual Visit via Video Note  I connected with Brett Hall on 04/09/19 at  8:10 AM EDT by a video enabled telemedicine application and verified that I am speaking with the correct person using two identifiers.   I discussed the limitations of evaluation and management by telemedicine and the availability of in person appointments. The patient expressed understanding and agreed to proceed.  Pt was at home and I was in my office for the virtual visit.      Subjective:    CC: "body tingling:  HPI:  No pain. "I feel myself" its in my legs, it feels internal, like chills but warm, goose bumps. Comes in spurts. Usually a few seconds but now can last several minutes. No palpitations, or SOB, CP  But he did have some discomfort the other day but this didn't come back. No rash. No fever, chills or chills.  Taking high dose vitamin C tablet.   Sxs started 2 weeks ago. No urinary sxs. No HA.  No loss of appetite.  Has felt more hungry.  He is actually worried about the possibility of cancer.  No recent upper respiratory symptoms.  No urinary symptoms.  Hasn't been sleeping as well daily.  He has been staying up a little bit late but has been trying to get at least 6 hours of sleep.  No other diet changes recently  Past medical history, Surgical history, Family history not pertinant except as noted below, Social history, Allergies, and medications have been entered into the medical record, reviewed, and corrections made.   Review of Systems: No fevers, chills, night sweats, weight loss, chest pain, or shortness of breath.   Objective:    General: Speaking clearly in complete sentences without any shortness of breath.  Alert and oriented x3.  Normal judgment. No apparent acute distress. Well groomed.     Impression and Recommendations:   Chills sensation-unclear etiology it does not sound like he has had any type of viral respiratory symptoms at all.  The only thing that he is really changed  was adding a high-dose vitamin C to his protein shake certainly he could stop that for a few days to see if he notices a difference.  He also has not been sleeping well he is only been getting about 6 hours of sleep most nights so we discussed working on that purposefully to see if resting actually improves his symptoms.  Also go ahead and get labs just to make sure renal function kidney function thyroid etc. are normal.  No sign of anemia.  And he has had problems with enlarged prostate gland for several years already even though he is only 47 so we will also check a PSA.    Elevated blood pressure-normally his blood pressure looks fantastic so I did ask him to check it again later today.  He says he was rushing to try to get his vitals because he had just woken up before the appointment.  Stays elevated then I want him to follow it over the next couple days to see if that might even be contributing to some of his symptoms but I suspect that his blood pressure is actually normal.      I discussed the assessment and treatment plan with the patient. The patient was provided an opportunity to ask questions and all were answered. The patient agreed with the plan and demonstrated an understanding of the instructions.   The patient was advised to call back or seek an in-person  evaluation if the symptoms worsen or if the condition fails to improve as anticipated.   Nani Gasser, MD

## 2019-04-10 LAB — COMPLETE METABOLIC PANEL WITH GFR
AG Ratio: 1.5 (calc) (ref 1.0–2.5)
ALT: 20 U/L (ref 9–46)
AST: 24 U/L (ref 10–40)
Albumin: 4.5 g/dL (ref 3.6–5.1)
Alkaline phosphatase (APISO): 43 U/L (ref 36–130)
BUN: 15 mg/dL (ref 7–25)
CO2: 29 mmol/L (ref 20–32)
Calcium: 9.7 mg/dL (ref 8.6–10.3)
Chloride: 104 mmol/L (ref 98–110)
Creat: 1.12 mg/dL (ref 0.60–1.35)
GFR, Est African American: 91 mL/min/{1.73_m2} (ref >=60)
GFR, Est Non African American: 79 mL/min/{1.73_m2} (ref >=60)
Globulin: 3.1 g/dL (calc) (ref 1.9–3.7)
Glucose, Bld: 99 mg/dL (ref 65–139)
Potassium: 4.6 mmol/L (ref 3.5–5.3)
Sodium: 138 mmol/L (ref 135–146)
Total Bilirubin: 0.4 mg/dL (ref 0.2–1.2)
Total Protein: 7.6 g/dL (ref 6.1–8.1)

## 2019-04-10 LAB — CBC
HCT: 40.9 % (ref 38.5–50.0)
Hemoglobin: 13.5 g/dL (ref 13.2–17.1)
MCH: 29 pg (ref 27.0–33.0)
MCHC: 33 g/dL (ref 32.0–36.0)
MCV: 88 fL (ref 80.0–100.0)
MPV: 12.3 fL (ref 7.5–12.5)
Platelets: 182 10*3/uL (ref 140–400)
RBC: 4.65 10*6/uL (ref 4.20–5.80)
RDW: 12.8 % (ref 11.0–15.0)
WBC: 3.8 10*3/uL (ref 3.8–10.8)

## 2019-04-10 LAB — TSH: TSH: 1.91 mIU/L (ref 0.40–4.50)

## 2019-04-10 LAB — LIPID PANEL
Cholesterol: 183 mg/dL (ref <200)
HDL: 53 mg/dL (ref >=40)
LDL Cholesterol (Calc): 110 mg/dL (calc) — ABNORMAL HIGH
Non-HDL Cholesterol (Calc): 130 mg/dL (calc) — ABNORMAL HIGH (ref <130)
Total CHOL/HDL Ratio: 3.5 (calc) (ref <5.0)
Triglycerides: 101 mg/dL (ref <150)

## 2019-04-10 LAB — PSA: PSA: 0.7 ng/mL (ref <=4.0)

## 2019-07-28 DIAGNOSIS — R0789 Other chest pain: Secondary | ICD-10-CM | POA: Diagnosis not present

## 2019-08-14 ENCOUNTER — Telehealth: Payer: Self-pay

## 2019-08-14 ENCOUNTER — Ambulatory Visit (INDEPENDENT_AMBULATORY_CARE_PROVIDER_SITE_OTHER): Payer: BC Managed Care – PPO | Admitting: Family Medicine

## 2019-08-14 ENCOUNTER — Other Ambulatory Visit: Payer: Self-pay

## 2019-08-14 ENCOUNTER — Ambulatory Visit (INDEPENDENT_AMBULATORY_CARE_PROVIDER_SITE_OTHER): Payer: BC Managed Care – PPO

## 2019-08-14 VITALS — Temp 98.1°F | Wt 191.0 lb

## 2019-08-14 DIAGNOSIS — R079 Chest pain, unspecified: Secondary | ICD-10-CM

## 2019-08-14 MED ORDER — OMEPRAZOLE 40 MG PO CPDR: 40.0000 mg | DELAYED_RELEASE_CAPSULE | Freq: Every day | ORAL | 3 refills | Status: DC

## 2019-08-14 NOTE — Patient Instructions (Addendum)
Thank you for coming in today. Get xray and labs now.  If everything looks normal I will place a referral to cardiology.  You should hear about cardiology appointment in about 1 week or less. Let me know if you do not hear anything.  Ok to do normal activity if not worsening.  Keep exercise mild to moderate for a while.   Let me know if things change.   I will add acid blocking medicine for the remote possibility that this is stomach irritation.  Take omeprazole daily for 1 month.    Nonspecific Chest Pain, Adult Chest pain can be caused by many different conditions. It can be caused by a condition that is life-threatening and requires treatment right away. It can also be caused by something that is not life-threatening. If you have chest pain, it can be hard to know the difference, so it is important to get help right away to make sure that you do not have a serious condition. Some life-threatening causes of chest pain include:  Heart attack.  A tear in the body's main blood vessel (aortic dissection).  Inflammation around your heart (pericarditis).  A problem in the lungs, such as a blood clot (pulmonary embolism) or a collapsed lung (pneumothorax). Some non life-threatening causes of chest pain include:  Heartburn.  Anxiety or stress.  Damage to the bones, muscles, and cartilage that make up your chest wall.  Pneumonia or bronchitis.  Shingles infection (varicella-zoster virus). Chest pain can feel like:  Pain or discomfort on the surface of your chest or deep in your chest.  Crushing, pressure, aching, or squeezing pain.  Burning or tingling.  Dull or sharp pain that is worse when you move, cough, or take a deep breath.  Pain or discomfort that is also felt in your back, neck, jaw, shoulder, or arm, or pain that spreads to any of these areas. Your chest pain may come and go. It may also be constant. Your health care provider will do lab tests and other studies to  find the cause of your pain. Treatment will depend on the cause of your chest pain. Follow these instructions at home: Medicines  Take over-the-counter and prescription medicines only as told by your health care provider.  If you were prescribed an antibiotic, take it as told by your health care provider. Do not stop taking the antibiotic even if you start to feel better. Lifestyle   Rest as directed by your health care provider.  Do not use any products that contain nicotine or tobacco, such as cigarettes and e-cigarettes. If you need help quitting, ask your health care provider.  Do not drink alcohol.  Make healthy lifestyle choices as recommended. These may include: ? Getting regular exercise. Ask your health care provider to suggest some activities that are safe for you. ? Eating a heart-healthy diet. This includes plenty of fresh fruits and vegetables, whole grains, low-fat (lean) protein, and low-fat dairy products. A dietitian can help you find healthy eating options. ? Maintaining a healthy weight. ? Managing any other health conditions you have, such as high blood pressure (hypertension) or diabetes. ? Reducing stress, such as with yoga or relaxation techniques. General instructions  Pay attention to any changes in your symptoms. Tell your health care provider about them or any new symptoms.  Avoid any activities that cause chest pain.  Keep all follow-up visits as told by your health care provider. This is important. This includes visits for any further testing if  your chest pain does not go away. Contact a health care provider if:  Your chest pain does not go away.  You feel depressed.  You have a fever. Get help right away if:  Your chest pain gets worse.  You have a cough that gets worse, or you cough up blood.  You have severe pain in your abdomen.  You faint.  You have sudden, unexplained chest discomfort.  You have sudden, unexplained discomfort in your  arms, back, neck, or jaw.  You have shortness of breath at any time.  You suddenly start to sweat, or your skin gets clammy.  You feel nausea or you vomit.  You suddenly feel lightheaded or dizzy.  You have severe weakness, or unexplained weakness or fatigue.  Your heart begins to beat quickly, or it feels like it is skipping beats. These symptoms may represent a serious problem that is an emergency. Do not wait to see if the symptoms will go away. Get medical help right away. Call your local emergency services (911 in the [place] Do not drive yourself to the hospital. Summary  Chest pain can be caused by a condition that is serious and requires urgent treatment. It may also be caused by something that is not life-threatening.  If you have chest pain, it is very important to see your health care provider. Your health care provider may do lab tests and other studies to find the cause of your pain.  Follow your health care provider's instructions on taking medicines, making lifestyle changes, and getting emergency treatment if symptoms become worse.  Keep all follow-up visits as told by your health care provider. This includes visits for any further testing if your chest pain does not go away. This information is not intended to replace advice given to you by your health care provider. Make sure you discuss any questions you have with your health care provider. Document Released: 08/30/2005 Document Revised: 05/23/2018 Document Reviewed: 05/23/2018 Elsevier Patient Education  2020 [employer].

## 2019-08-14 NOTE — Progress Notes (Signed)
Brett Hall is a 48 y.o. male who presents to [facility] today for chest pain.  Patient exercises regularly with home exercises including weightlifting.  Starting on August 24 he experienced a pinching sensation in the left anterior chest wall associate with some soreness.  He presented to an urgent care where an EKG was done which was read as normal.  Since then he is continued to experience intermittent sporadic pinching sensation in his left side of his chest and some soreness as well.  He denies any exertional component or palpitation.  He denies any shortness of breath fevers chills nausea vomiting or diarrhea.  The symptoms are not associated with food or exercise or eating.  He denies any change in his diet.  He feels well otherwise.    ROS as above:  Exam:  Temp 98.1 F (36.7 C) (Oral)   Wt 191 lb (86.6 kg)   SpO2 100%   BMI 26.64 kg/m  Wt Readings from Last 5 Encounters:  08/14/19 191 lb (86.6 kg)  04/09/19 195 lb 1.6 oz (88.5 kg)  05/14/18 194 lb (88 kg)  04/12/18 192 lb (87.1 kg)  09/18/17 193 lb (87.5 kg)    Gen: Well NAD HEENT: EOMI,  MMM Lungs: Normal work of breathing. CTABL Heart: RRR no MRG Chest wall: Nontender.  Normal shoulder and chest wall motion without pain. Abd: NABS, Soft. Nondistended, Nontender Exts: Brisk capillary refill, warm and well perfused.   Lab and Radiology Results Two-view chest x-ray images obtained today personally independently reviewed Normal-appearing chest x-ray.  No mediastinal thickening or widening.  Normal heart size.  Normal lung fields.  Normal ribs. Await formal radiology review  Twelve-lead EKG obtained August 24 at urgent care shows normal sinus rhythm with heart rate 69 bpm.  QTc 413.  Normal axis.  Normal morphology.  No ST segment elevation or depression.  No Q waves.  Normal EKG. (EKG sent to  scan)   Assessment and Plan: 48 y.o. male with chest pain.  Likely atypical however given his age he is at some risk.  Additionally the symptoms are not improving after a little bit of rest for 2 weeks.  Plan to broaden work-up including labs as below CBC CMP CK troponin.   Assuming everything comes back normal likely refer to cardiology for further restrictor stratification possibly stress test.   Additionally will empirically prescribe omeprazole.  It is possible that some of symptoms may be GI related and  omeprazole likely will not hurt and may help.   PDMP not reviewed this encounter. Orders Placed This Encounter  Procedures  . DG Chest 2 View    Order Specific Question:   Reason for exam:    Answer:   Left sidded chest pain    Order Specific Question:   Preferred imaging location?    Answer:   [facility]  . CBC  . COMPLETE METABOLIC PANEL WITH GFR  . CK  . Troponin I   Meds ordered this encounter  Medications  . omeprazole (PRILOSEC) 40 MG capsule    Sig: Take 1 capsule (40 mg total) by mouth daily.    Dispense:  30 capsule    Refill:  3     Historical information moved to improve visibility of documentation.  Past Medical History:  Diagnosis Date  . Chest pain, atypical   . Femoral hernia   . GERD (gastroesophageal reflux disease)   . RBBB (right  bundle branch block with left anterior fascicular block)    hemblock   Past Surgical History:  Procedure Laterality Date  . none     Social History   Tobacco Use  . Smoking status: Never Smoker  . Smokeless tobacco: Never Used  Substance Use Topics  . Alcohol use: No    Comment: occasiona   family history includes Coronary artery disease in his unknown relative; Diabetes in his unknown relative.  Medications: Current Outpatient Medications  Medication Sig Dispense Refill  . omeprazole (PRILOSEC) 40 MG capsule Take 1 capsule (40 mg total) by mouth daily. 30 capsule 3   No current  facility-administered medications for this visit.    Allergies  Allergen Reactions  . Citric Acid      Discussed warning signs or symptoms. Please see discharge instructions. Patient expresses understanding.

## 2019-08-14 NOTE — Telephone Encounter (Signed)
Received call report on patient's CXR. Please see impression where it notes nodule and recommended CT follow up.   Dr Georgina Snell not in office, I shared results with Dr Sheppard Coil to make a provider aware. Sending msg to PCP and ordering provider

## 2019-08-15 ENCOUNTER — Encounter: Payer: Self-pay | Admitting: Family Medicine

## 2019-08-15 ENCOUNTER — Telehealth: Payer: Self-pay | Admitting: Family Medicine

## 2019-08-15 DIAGNOSIS — R918 Other nonspecific abnormal finding of lung field: Secondary | ICD-10-CM

## 2019-08-15 DIAGNOSIS — R079 Chest pain, unspecified: Secondary | ICD-10-CM

## 2019-08-15 DIAGNOSIS — R911 Solitary pulmonary nodule: Secondary | ICD-10-CM | POA: Insufficient documentation

## 2019-08-15 DIAGNOSIS — R748 Abnormal levels of other serum enzymes: Secondary | ICD-10-CM | POA: Insufficient documentation

## 2019-08-15 LAB — COMPLETE METABOLIC PANEL WITH GFR
AG Ratio: 1.5 (calc) (ref 1.0–2.5)
ALT: 23 U/L (ref 9–46)
AST: 30 U/L (ref 10–40)
Albumin: 4.6 g/dL (ref 3.6–5.1)
Alkaline phosphatase (APISO): 48 U/L (ref 36–130)
BUN: 19 mg/dL (ref 7–25)
CO2: 27 mmol/L (ref 20–32)
Calcium: 9.7 mg/dL (ref 8.6–10.3)
Chloride: 102 mmol/L (ref 98–110)
Creat: 1.23 mg/dL (ref 0.60–1.35)
GFR, Est African American: 81 mL/min/{1.73_m2} (ref >=60)
GFR, Est Non African American: 70 mL/min/{1.73_m2} (ref >=60)
Globulin: 3.1 g/dL (calc) (ref 1.9–3.7)
Glucose, Bld: 95 mg/dL (ref 65–99)
Potassium: 4.6 mmol/L (ref 3.5–5.3)
Sodium: 137 mmol/L (ref 135–146)
Total Bilirubin: 0.3 mg/dL (ref 0.2–1.2)
Total Protein: 7.7 g/dL (ref 6.1–8.1)

## 2019-08-15 LAB — CBC
HCT: 42.4 % (ref 38.5–50.0)
Hemoglobin: 14 g/dL (ref 13.2–17.1)
MCH: 29.5 pg (ref 27.0–33.0)
MCHC: 33 g/dL (ref 32.0–36.0)
MCV: 89.3 fL (ref 80.0–100.0)
MPV: 12.5 fL (ref 7.5–12.5)
Platelets: 146 10*3/uL (ref 140–400)
RBC: 4.75 10*6/uL (ref 4.20–5.80)
RDW: 12.7 % (ref 11.0–15.0)
WBC: 4.4 10*3/uL (ref 3.8–10.8)

## 2019-08-15 LAB — CK: Total CK: 436 U/L — ABNORMAL HIGH (ref 44–196)

## 2019-08-15 LAB — TROPONIN I: Troponin I: <0.01 ng/mL (ref <=0.0)

## 2019-08-15 NOTE — Telephone Encounter (Signed)
Pt advised of results, verbalized understanding. No further questions.  

## 2019-08-15 NOTE — Telephone Encounter (Signed)
Chest x-ray showed small nodule.  Radiology recommends CT scan.  CT scan ordered.

## 2019-08-18 ENCOUNTER — Ambulatory Visit (INDEPENDENT_AMBULATORY_CARE_PROVIDER_SITE_OTHER): Payer: BC Managed Care – PPO

## 2019-08-18 ENCOUNTER — Other Ambulatory Visit: Payer: Self-pay

## 2019-08-18 ENCOUNTER — Other Ambulatory Visit: Payer: BC Managed Care – PPO

## 2019-08-18 DIAGNOSIS — R911 Solitary pulmonary nodule: Secondary | ICD-10-CM

## 2019-08-18 DIAGNOSIS — R918 Other nonspecific abnormal finding of lung field: Secondary | ICD-10-CM | POA: Diagnosis not present

## 2019-08-18 DIAGNOSIS — R079 Chest pain, unspecified: Secondary | ICD-10-CM

## 2019-08-18 NOTE — Telephone Encounter (Signed)
OK, looks like CT has been ordered and it pending.

## 2019-08-21 ENCOUNTER — Ambulatory Visit (INDEPENDENT_AMBULATORY_CARE_PROVIDER_SITE_OTHER): Payer: BC Managed Care – PPO | Admitting: Family Medicine

## 2019-08-21 ENCOUNTER — Other Ambulatory Visit: Payer: Self-pay

## 2019-08-21 VITALS — BP 120/62 | HR 60 | Temp 97.8°F | Wt 192.0 lb

## 2019-08-21 DIAGNOSIS — R079 Chest pain, unspecified: Secondary | ICD-10-CM

## 2019-08-21 DIAGNOSIS — R748 Abnormal levels of other serum enzymes: Secondary | ICD-10-CM | POA: Diagnosis not present

## 2019-08-21 NOTE — Progress Notes (Signed)
Brett LemonsMichael Hall is a 48 y.o. male who [name] today for follow-up chest pain and elevated CK.  Patient was seen on September 10 for left-sided atypical chest pain.  He had work-up including EKG chest x-ray and lab assessment.  EKG was normal.  Chest x-ray showed a small solitary pulmonary nodule at the left lung field and lab assessment showed normal troponins but did have a mildly elevated CK at 400.  In the interim he is feeling a lot better.  His left-sided chest pain has resolved.  He notes a slight pinching needlelike sensation in the right side of his chest wall.  He denies any exertional component fevers chills nausea vomiting or diarrhea.  He had noncontrast CT scan of the chest to follow-up the pulmonary nodule as recommended by radiology which was normal.    ROS as above:  Exam:  BP 120/62   Pulse 60   Temp 97.8 F (36.6 C) (Oral)   Wt 192 lb (87.1 kg)   SpO2 100%   BMI 26.78 kg/m  Wt Readings from Last 5 Encounters:  08/21/19 192 lb (87.1 kg)  08/14/19 191 lb (86.6 kg)  04/09/19 195 lb 1.6 oz (88.5 kg)  05/14/18 194 lb (88 kg)  04/12/18 192 lb (87.1 kg)    Gen: Well NAD HEENT: EOMI,  MMM Lungs: Normal work of breathing. CTABL Heart: RRR no MRG Abd: NABS, Soft. Nondistended, Nontender Exts: Brisk capillary refill, warm and well perfused.   Lab and Radiology Results No results found for this or any previous visit (from the past 72 hour(s)). Ct Chest Wo Contrast  Result Date: 08/19/2019 CLINICAL DATA:  eval nodule seen on CXR on 08-14-19. Chest pain. No hx surg. Non-smoker EXAM: CT CHEST WITHOUT CONTRAST TECHNIQUE: Multidetector CT imaging of the chest was performed following the standard protocol without IV contrast. COMPARISON:  Chest radiograph 08/14/2019 FINDINGS: Cardiovascular: No significant vascular findings. Normal heart size.  No pericardial effusion. Mediastinum/Nodes: No enlarged mediastinal or axillary lymph nodes. Thyroid gland, trachea, and esophagus demonstrate no significant findings. Lungs/Pleura: Minimal linear opacities at the left base likely scarring or atelectasis. No pleural effusion or pneumothorax. There are no pulmonary nodules. There is no nodule in the left lower lung to correspond to the findings seen on prior chest radiograph, which may have represented a vessel on end or small area of mucous plugging that has resolved. Upper Abdomen: No acute abnormality. Musculoskeletal: No chest wall mass or suspicious bone lesions identified. IMPRESSION: No pulmonary nodule in the left lung to correspond to the finding seen on prior chest radiograph, which may have represented a vessel-on-end or small area of mucous plugging that has resolved. Electronically Signed   By: Emmaline KluverNancy  Ballantyne M.D.   On: 08/19/2019 08:46   I personally (independently) visualized and performed the interpretation of the images attached in this note.   Recent Results (from the past 2160 hour(s))  CBC     Status: None   Collection Time: 08/14/19 11:54 AM  Result Value Ref Range   WBC 4.4 3.8 - 10.8 Thousand/uL   RBC 4.75 4.20 - 5.80 Million/uL   Hemoglobin 14.0 13.2 - 17.1 g/dL   HCT 16.142.4 09.638.5 - 04.550.0 %   MCV 89.3 80.0 - 100.0 fL   MCH 29.5 27.0 - 33.0 pg   MCHC 33.0 32.0 - 36.0 g/dL   RDW 40.912.7 81.111.0 - 91.415.0 %   Platelets 146 140 -  400 Thousand/uL   MPV 12.5 7.5 - 12.5 fL  COMPLETE METABOLIC PANEL WITH GFR     Status: None   Collection Time: 08/14/19 11:54 AM  Result Value Ref Range   Glucose, Bld 95 65 - 99 mg/dL    Comment: .            Fasting reference interval .    BUN 19 7 - 25 mg/dL   Creat 1.23 0.60 - 1.35 mg/dL   GFR, Est Non African American 70 > OR = 60 mL/min/1.45m2   GFR, Est African American 81 > OR = 60 mL/min/1.69m2   BUN/Creatinine Ratio NOT APPLICABLE 6 - 22 (calc)   Sodium 137 135 - 146 mmol/L   Potassium  4.6 3.5 - 5.3 mmol/L   Chloride 102 98 - 110 mmol/L   CO2 27 20 - 32 mmol/L   Calcium 9.7 8.6 - 10.3 mg/dL   Total Protein 7.7 6.1 - 8.1 g/dL   Albumin 4.6 3.6 - 5.1 g/dL   Globulin 3.1 1.9 - 3.7 g/dL (calc)   AG Ratio 1.5 1.0 - 2.5 (calc)   Total Bilirubin 0.3 0.2 - 1.2 mg/dL   Alkaline phosphatase (APISO) 48 36 - 130 U/L   AST 30 10 - 40 U/L   ALT 23 9 - 46 U/L  CK     Status: Abnormal   Collection Time: 08/14/19 11:54 AM  Result Value Ref Range   Total CK 436 (H) 44 - 196 U/L  Troponin I     Status: None   Collection Time: 08/14/19 11:54 AM  Result Value Ref Range   Troponin I <0.01 < OR = 0.0 ng/mL    Comment: . In accord with published recommendations, serial testing of troponin I at intervals of 2 to 4 hours for up to 12 to 24 hours is suggested in order to corroborate a single troponin I result. An elevated troponin alone is not sufficient to make the diagnosis of MI. . . For additional information, please refer to  http://education.questdiagnostics.com/faq/FAQ202  (This link is being provided for informational/ educational purposes only.)       Assessment and Plan: 48 y.o. male with   Chest pain: For the most part resolved.  Patient has occasional right-sided symptoms.  With normal EKG normal troponins and improving symptoms I think further watchful waiting at this point is reasonable.  If needed can proceed with more work-up.  Elevated CK: Unclear etiology.  Patient had exercise somewhat recent prior to lab assessment which could explain elevated CK.  Plan to recheck CK along with sed rate in about a week or so when does not exercise for a few days.  TSH was checked in June and was normal.  Recheck near future as needed.  I spent 25 minutes with this patient, greater than 50% was face-to-face time counseling regarding lab results CT results next steps and backup plan.   PDMP not reviewed this encounter. No orders of the defined types were placed in this  encounter.  No orders of the defined types were placed in this encounter.    Historical information moved to improve visibility of documentation.  Past Medical History:  Diagnosis Date  . Chest pain, atypical   . Femoral hernia   . GERD (gastroesophageal reflux disease)   . RBBB (right bundle branch block with left anterior fascicular block)    hemblock   Past Surgical History:  Procedure Laterality Date  . none     Social History  Tobacco Use  . Smoking status: Never Smoker  . Smokeless tobacco: Never Used  Substance Use Topics  . Alcohol use: No    Comment: occasiona   family history includes Coronary artery disease in his unknown relative; Diabetes in his unknown relative.  Medications: Current Outpatient Medications  Medication Sig Dispense Refill  . omeprazole (PRILOSEC) 40 MG capsule Take 1 capsule (40 mg total) by mouth daily. 30 capsule 3   No current facility-administered medications for this visit.    Allergies  Allergen Reactions  . Citric Acid      Discussed warning signs or symptoms. Please see discharge instructions. Patient expresses understanding.

## 2019-08-21 NOTE — Patient Instructions (Signed)
Thank you for coming in today. Repeat blood work when you have not exercised for a few days.  If the chest pain worsened let me know and we can proceed with more testing.  We will continue to follow.    Creatine Kinase Test Why am I having this test? The creatine kinase (CK) test is done to check for damage to muscle tissue in the body. When muscles are damaged, they release CK into the bloodstream.  This test can be used to help diagnose a heart attack or diseases of the skeletal muscles, brain, or spinal cord. What is being tested? The creatine kinase test may measure the following:  The total amount of CK in your blood (total CK).  The amount of three different forms of CK (isoenzymes) in the blood: ? CK-MM, which is found in your skeletal muscles and heart. ? CK-MB, which is found mostly in your heart. ? CK-BB, which is found mostly in your brain. What kind of sample is taken?  At least one blood sample is required for this test. It is usually collected by inserting a needle into a blood vessel. In some cases, you may need to have blood samples taken at regular intervals for up to 1 week. Tell a health care provider about:  All medicines you are taking, including vitamins, herbs, eye drops, creams, and over-the-counter medicines.  Any blood disorders you have.  Any surgeries you have had.  Any medical conditions you have.  Whether you are pregnant or may be pregnant. How are the results reported? Your results will be reported as values that indicate:  How much total CK is in your blood, given as units per liter (units/L).  How much of each measured isoenzyme is in your blood, given as a percentage. Your health care provider will compare your results to normal ranges that were established after testing a large group of people (reference values). Reference values may vary among labs and hospitals. For total CK, common reference values are ranges that vary by age:  Adult or  elderly (values are higher after exercise): ? Male: 55-170 units/L. ? Male: 30-135 units/L.  Newborn: 68-580 units/L. Reference values for each isoenzyme are:  CK-MM: 100%.  CK-MB: 0%.  CK-BB: 0%. What do the results mean? Results within reference ranges and values are normal. Levels of total CK that are higher than the reference ranges may mean that you have an injury or a disease affecting your heart, skeletal muscles, or brain. High levels of CK-MM may mean that you have:  Certain conditions affecting the skeletal muscle. A variety of conditions can lead to breakdown of skeletal muscle (rhabdomyolysis).  A recent history of surgery or injury.  Conditions that cause convulsions. These are episodes of uncontrollable movement caused by sudden, intense tightening (contraction) of the muscles. High levels of CK-MB may mean that you have:  A recent history of heart attack.  Other conditions that cause injury to the heart muscle. High levels of CK-BB may be caused by:  Taking certain psychiatric medicines.  A disease that affects the brain and spinal cord (central nervous system).  Certain types of cancer.  Injury to the lungs. Talk with your health care provider about what your results mean. Questions to ask your health care provider Ask your health care provider, or the department that is doing the test:  When will my results be ready?  How will I get my results?  What are my treatment options?  What other tests  do I need?  What are my next steps? Summary  The creatine kinase (CK) test is done to check for damage to muscle tissue in the body.  This test can be used to help diagnose a heart attack or diseases of the skeletal muscles, brain, or spinal cord.  This test involves measuring total CK and three different forms of CK in the blood.  Talk to your health care provider about what your results may mean. This information is not intended to replace advice  given to you by your health care provider. Make sure you discuss any questions you have with your health care provider. Document Released: 12/21/2004 Document Revised: 06/21/2017 Document Reviewed: 06/21/2017 Elsevier Patient Education  2020 [employer]

## 2019-09-08 ENCOUNTER — Encounter: Payer: Self-pay | Admitting: Family Medicine

## 2019-10-29 ENCOUNTER — Other Ambulatory Visit: Payer: Self-pay

## 2019-10-29 ENCOUNTER — Encounter: Payer: Self-pay | Admitting: Family Medicine

## 2019-10-29 ENCOUNTER — Ambulatory Visit (INDEPENDENT_AMBULATORY_CARE_PROVIDER_SITE_OTHER): Payer: BC Managed Care – PPO | Admitting: Family Medicine

## 2019-10-29 VITALS — BP 122/82 | HR 73 | Ht 68.0 in | Wt 195.0 lb

## 2019-10-29 DIAGNOSIS — R748 Abnormal levels of other serum enzymes: Secondary | ICD-10-CM | POA: Diagnosis not present

## 2019-10-29 DIAGNOSIS — N5082 Scrotal pain: Secondary | ICD-10-CM | POA: Diagnosis not present

## 2019-10-29 DIAGNOSIS — N400 Enlarged prostate without lower urinary tract symptoms: Secondary | ICD-10-CM

## 2019-10-29 DIAGNOSIS — R7301 Impaired fasting glucose: Secondary | ICD-10-CM

## 2019-10-29 DIAGNOSIS — R102 Pelvic and perineal pain: Secondary | ICD-10-CM

## 2019-10-29 LAB — POCT URINALYSIS DIP (CLINITEK)
Bilirubin, UA: NEGATIVE
Blood, UA: NEGATIVE
Glucose, UA: NEGATIVE mg/dL
Ketones, POC UA: NEGATIVE mg/dL
Leukocytes, UA: NEGATIVE
Nitrite, UA: NEGATIVE
POC PROTEIN,UA: NEGATIVE
Spec Grav, UA: 1.025 (ref 1.010–1.025)
Urobilinogen, UA: 0.2 E.U./dL
pH, UA: 6 (ref 5.0–8.0)

## 2019-10-29 LAB — POCT GLYCOSYLATED HEMOGLOBIN (HGB A1C): HbA1c POC (<> result, manual entry): 5.5 % (ref 4.0–5.6)

## 2019-10-29 NOTE — Assessment & Plan Note (Addendum)
A1C is normal today. Back into the normal range.

## 2019-10-29 NOTE — Assessment & Plan Note (Signed)
Due to recheck CK level.

## 2019-10-29 NOTE — Progress Notes (Signed)
Established Patient Office Visit  Subjective:  Patient ID: Brett Hall, male    DOB: [birth year]  Age: 48 y.o. MRN: [id number]  CC:  Chief Complaint  Patient presents with  . Follow-up    HPI Brett Hall presents for lower abdominal pain.  He does workout regularly and says sometimes he gets some pain more in his upper abdomen and then it seems to radiate into his lower abdomen and occasionally will radiate down into the pelvic area numbness into the groin.  He is actually complained about pelvic pain previously and in fact has a diagnosis of chronic prostatodynia.  He just feels like something is not right.  He actually just had a PSA test done 6 months ago.  We tend to check this yearly because of a family history of prostate cancer.  He feels like he is urinating okay he is not had any problems stopping or starting.  No blood in the urine or stool.  He said he did not feel any swollen glands or lymph nodes in the pelvic area.  Is just a discomfort.  Occasionally he gets a most a pinching sensation near the rectal area usually just last for few seconds.  He says the pain is not enough to stop him he has been able to work out and it does not usually occur with exercise in fact it usually is more so at rest.  sometimes the pain will move into his inner thigh as well. He feels like his bowels are moving normally he denies constipation and says he moves his bowels daily without any difficulty.  He says in the last 3 to 4 days he is just felt a little bit more tired and just does not feel quite right and so wanted to come in to be evaluated.  He also wondered if it was time to get screened for colon cancer.  Impaired fasting glucose-no increased thirst or urination. No symptoms consistent with hypoglycemia.   Past Medical History:  Diagnosis Date  . Chest pain, atypical   . Femoral hernia   . GERD (gastroesophageal reflux disease)   . RBBB (right bundle branch block with left anterior  fascicular block)    hemblock    Past Surgical History:  Procedure Laterality Date  . none      Family History  Problem Relation Age of Onset  . Coronary artery disease Unknown        No family history of premature CAD  . Diabetes Unknown     Social History   Socioeconomic History  . Marital status: Married    Spouse name: Not on file  . Number of children: Not on file  . Years of education: Not on file  . Highest education level: Not on file  Occupational History  . Occupation: Veterinary surgeonealtor  Social Needs  . Financial resource strain: Not on file  . Food insecurity    Worry: Not on file    Inability: Not on file  . Transportation needs    Medical: Not on file    Non-medical: Not on file  Tobacco Use  . Smoking status: Never Smoker  . Smokeless tobacco: Never Used  Substance and Sexual Activity  . Alcohol use: No    Comment: occasiona  . Drug use: No  . Sexual activity: Not on file  Lifestyle  . Physical activity    Days per week: Not on file    Minutes per session: Not on file  . Stress: Not  on file  Relationships  . Social Herbalist on phone: Not on file    Gets together: Not on file    Attends religious service: Not on file    Active member of club or organization: Not on file    Attends meetings of clubs or organizations: Not on file    Relationship status: Not on file  . Intimate partner violence    Fear of current or ex partner: Not on file    Emotionally abused: Not on file    Physically abused: Not on file    Forced sexual activity: Not on file  Other Topics Concern  . Not on file  Social History Narrative  . Not on file    Outpatient Medications Prior to Visit  Medication Sig Dispense Refill  . omeprazole (PRILOSEC) 40 MG capsule Take 1 capsule (40 mg total) by mouth daily. 30 capsule 3   No facility-administered medications prior to visit.     Allergies  Allergen Reactions  . Citric Acid     ROS Review of Systems     Objective:    Physical Exam  Constitutional: He is oriented to person, place, and time. He appears well-developed and well-nourished.  HENT:  Head: Normocephalic and atraumatic.  Cardiovascular: Normal rate, regular rhythm and normal heart sounds.  Pulmonary/Chest: Effort normal and breath sounds normal.  Abdominal: Soft. Bowel sounds are normal. He exhibits no distension and no mass. There is no abdominal tenderness. There is no rebound and no guarding.  Neurological: He is alert and oriented to person, place, and time.  Skin: Skin is warm and dry.  Psychiatric: He has a normal mood and affect. His behavior is normal.    BP 122/82   Pulse 73   Ht 5\' 8"  (1.727 m)   Wt 195 lb (88.5 kg)   BMI 29.65 kg/m  Wt Readings from Last 3 Encounters:  10/29/19 195 lb (88.5 kg)  08/21/19 192 lb (87.1 kg)  08/14/19 191 lb (86.6 kg)     There are no preventive care reminders to display for this patient.  There are no preventive care reminders to display for this patient.  Lab Results  Component Value Date   TSH 1.91 04/09/2019   Lab Results  Component Value Date   WBC 4.4 08/14/2019   HGB 14.0 08/14/2019   HCT 42.4 08/14/2019   MCV 89.3 08/14/2019   PLT 146 08/14/2019   Lab Results  Component Value Date   NA 137 08/14/2019   K 4.6 08/14/2019   CO2 27 08/14/2019   GLUCOSE 95 08/14/2019   BUN 19 08/14/2019   CREATININE 1.23 08/14/2019   BILITOT 0.3 08/14/2019   ALKPHOS 45 07/31/2016   AST 30 08/14/2019   ALT 23 08/14/2019   PROT 7.7 08/14/2019   ALBUMIN 4.2 07/31/2016   CALCIUM 9.7 08/14/2019   Lab Results  Component Value Date   CHOL 183 04/09/2019   Lab Results  Component Value Date   HDL 53 04/09/2019   Lab Results  Component Value Date   LDLCALC 110 (H) 04/09/2019   Lab Results  Component Value Date   TRIG 101 04/09/2019   Lab Results  Component Value Date   CHOLHDL 3.5 04/09/2019   Lab Results  Component Value Date   HGBA1C 5.5 10/29/2019       Assessment & Plan:   Problem List Items Addressed This Visit      Endocrine   IFG (impaired fasting glucose)  A1C is normal today. Back into the normal range.        Relevant Orders   POCT HgB A1C (Completed)     Other   Pelvic pain in male - Primary   Relevant Orders   POCT URINALYSIS DIP (CLINITEK) (Completed)   MR PELVIS WO CONTRAST   Enlarged prostate    Has been on going for years.  Will move forward with imaging. He feels bowels are moving normally and denies any bulge, etc.  Abdomen is nontender.       Elevated CK    Due to recheck CK level.       Other Visit Diagnoses    Pain in scrotum       Relevant Orders   MR PELVIS WO CONTRAST      Pelvic pain-unclear etiology its not keeping him from doing things but enough that he came in to make an appointment today and it is noticeable.  In looking back over the years has come in multiple times for pelvic pain.  We have never done any imaging.  PSAs have always come back normal.  Did do a urinalysis today and it was negative which is reassuring.    No orders of the defined types were placed in this encounter.   Follow-up: No follow-ups on file.    Nani Gasser, MD

## 2019-10-29 NOTE — Assessment & Plan Note (Signed)
Has been on going for years.  Will move forward with imaging. He feels bowels are moving normally and denies any bulge, etc.  Abdomen is nontender.

## 2019-10-29 NOTE — Addendum Note (Signed)
Addended by: Narda Rutherford on: 10/29/2019 03:49 PM   Modules accepted: Orders

## 2019-10-30 LAB — CK: Total CK: 295 U/L — ABNORMAL HIGH (ref 44–196)

## 2019-10-30 LAB — SEDIMENTATION RATE: Sed Rate: 9 mm/h (ref 0–15)

## 2019-11-03 NOTE — Progress Notes (Signed)
You saw this patient on 11/25. CK going in the right direction but since you saw I thought you could give better insight on next steps.

## 2019-11-04 ENCOUNTER — Telehealth: Payer: Self-pay

## 2019-11-04 DIAGNOSIS — R102 Pelvic and perineal pain: Secondary | ICD-10-CM

## 2019-11-04 DIAGNOSIS — N400 Enlarged prostate without lower urinary tract symptoms: Secondary | ICD-10-CM

## 2019-11-04 NOTE — Telephone Encounter (Signed)
Thank you! Patient does need ultrasound for Mri. I have withdrawn the case online for Mri and will re-submit when he has ultrasound completed.  Information given to patient to schedule U/S and he has been updated on status of MRI  FYI to PCP

## 2019-11-04 NOTE — Telephone Encounter (Signed)
OK ordered signed. I think it may come own to his insurance though. They may require [place] first.

## 2019-11-04 NOTE — Telephone Encounter (Signed)
There were some concerns/queestions regarding imaging need for this patient.   I called and spoke with Radiologist Dr Jobe Igo and was advised that to see prostate as well as abdomen, an MRI of prostate needs to be ordered with and w/o contrast to be done at [facility].  This order has been pended, please advise

## 2019-11-05 ENCOUNTER — Other Ambulatory Visit: Payer: Self-pay

## 2019-11-05 ENCOUNTER — Ambulatory Visit (INDEPENDENT_AMBULATORY_CARE_PROVIDER_SITE_OTHER): Payer: BC Managed Care – PPO

## 2019-11-05 DIAGNOSIS — R102 Pelvic and perineal pain: Secondary | ICD-10-CM | POA: Diagnosis not present

## 2019-11-05 DIAGNOSIS — N400 Enlarged prostate without lower urinary tract symptoms: Secondary | ICD-10-CM

## 2019-11-11 ENCOUNTER — Telehealth: Payer: Self-pay

## 2019-11-11 NOTE — Telephone Encounter (Signed)
Dr Madilyn Fireman,   I have submitted information for patient's MRI Prostate W & W/O Contrast (CPT [phone]).   Per insurance, this close is pending denial and need to have a peer to peer done TODAY in order to keep this from closing out  As denied.   Medical record has been faxed to insurance already.   Peer to peer can be done by calling [phone]  Patients ID: [id number]

## 2019-11-11 NOTE — Telephone Encounter (Signed)
Advised Brett Hall H that pt no longer needed to have the MRI done per PCP due to his [place] being normal. Pt was also advised of this. Peer to peer not needed.Brett Hall, Brett Hall, CMA

## 2019-12-05 HISTORY — PX: VARICOCELE EXCISION: SUR582

## 2019-12-15 ENCOUNTER — Ambulatory Visit (INDEPENDENT_AMBULATORY_CARE_PROVIDER_SITE_OTHER): Payer: BC Managed Care – PPO | Admitting: Medical-Surgical

## 2019-12-15 ENCOUNTER — Other Ambulatory Visit: Payer: Self-pay

## 2019-12-15 ENCOUNTER — Encounter: Payer: Self-pay | Admitting: Medical-Surgical

## 2019-12-15 VITALS — BP 113/72 | HR 81 | Temp 98.0°F | Ht 68.0 in | Wt 200.1 lb

## 2019-12-15 DIAGNOSIS — R519 Headache, unspecified: Secondary | ICD-10-CM

## 2019-12-15 MED ORDER — PREDNISONE 10 MG PO TABS: ORAL_TABLET | ORAL | 0 refills | Status: AC

## 2019-12-15 NOTE — Progress Notes (Signed)
Subjective:    CC: Headaches on left keep coming back  HPI:  Pleasant 49 year old male presenting with complaints of discomfort x 7 days that started in the left temple and moved to the left side of the head along the back.  Has had intermittent headaches in the past but nothing like this current sensation.  Pain is described as discomfort/light ache rated at 1-2/10.  Reports it often feels as if there is a draining along the back of his head.  Sensation unrelieved by ibuprofen.  Also reports increased fatigue but acknowledges he has gone to bed later at times recently.  Sleeping well.  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 - trauma, photophobia, phonophobia, nausea, vomiting, vision changes, fever, facial pain/pressure, ear pain/pressure, runny nose, sore throat, cough, dizziness/lightheadedness, vertigo, neck/shoulder pain, weakness, and altered mental status.  Denies any sensory changes before the headache started.  Does not grind his teeth at night.  No recent dietary changes.  Eats a healthy diet, takes protein supplements, and exercises regularly.  Mother has a history of migraines, father is diabetic and patient reports he is prediabetic.   I reviewed the past medical history, family history, social history, surgical history, and allergies today and no changes were needed.  Please see the problem list section below in epic for further details.  Past Medical History: Past Medical History:  Diagnosis Date  . Chest pain, atypical   . Femoral hernia   . GERD (gastroesophageal reflux disease)   . RBBB (right bundle branch block with left anterior fascicular block)    hemblock   Past Surgical History: Past Surgical History:  Procedure Laterality Date  . none     Social History: Social History   Socioeconomic History  . Marital status: Married    Spouse name: Not on file  . Number of children: Not on file  . Years of education: Not on file  . Highest education level:  Not on file  Occupational History  . Occupation: Realtor  Tobacco Use  . Smoking status: Never Smoker  . Smokeless tobacco: Never Used  Substance and Sexual Activity  . Alcohol use: No    Comment: occasiona  . Drug use: No  . Sexual activity: Not on file  Other Topics Concern  . Not on file  Social History Narrative  . Not on file   Social Determinants of Health   Financial Resource Strain:   . Difficulty of Paying Living Expenses: Not on file  Food Insecurity:   . Worried About Charity fundraiser in the Last Year: Not on file  . Ran Out of Food in the Last Year: Not on file  Transportation Needs:   . Lack of Transportation (Medical): Not on file  . Lack of Transportation (Non-Medical): Not on file  Physical Activity:   . Days of Exercise per Week: Not on file  . Minutes of Exercise per Session: Not on file  Stress:   . Feeling of Stress : Not on file  Social Connections:   . Frequency of Communication with Friends and Family: Not on file  . Frequency of Social Gatherings with Friends and Family: Not on file  . Attends Religious Services: Not on file  . Active Member of Clubs or Organizations: Not on file  . Attends Archivist Meetings: Not on file  . Marital Status: Not on file   Family History: Family History  Problem Relation Age of Onset  . Coronary artery disease Unknown  No family history of premature CAD  . Diabetes Unknown    Allergies: Allergies  Allergen Reactions  . Citric Acid    Medications: See med rec.  Review of Systems: No fevers, chills, night sweats, weight loss, chest pain, or shortness of breath.   Objective:    General: Well Developed, well nourished, and in no acute distress.  Neuro: Alert and oriented x3, extra-ocular muscles intact, sensation grossly intact.  Muscle strength 5/5 in bilateral upper and lower extremities.  No gait abnormalities. HEENT: Normocephalic, atraumatic, pupils equal round reactive to light,  neck supple, no masses, no lymphadenopathy.  Bilateral external ear canals and TMs normal. Skin: Warm and dry, no rashes. Cardiac: Regular rate and rhythm, no murmurs rubs or gallops, no lower extremity edema.  Respiratory: Clear to auscultation bilaterally. Not using accessory muscles, speaking in full sentences.   Impression and Recommendations:    Acute intractable headache  Prednisone 10 mg taper (80 mg x 1 day, 60 mg x 1 day, 40 mg x 1 day, 20 mg x 1 day, then 10 mg x 1 day and stop).  If no improvement, will consider ESR and possible imaging.  Return if symptoms worsen or fail to improve.  Keep annual physical exam appointment scheduled for 12/24/2019.  ___________________________________________ Clearnce Sorrel, DNP, APRN, FNP-BC [facility]

## 2019-12-17 ENCOUNTER — Telehealth: Payer: Self-pay

## 2019-12-17 NOTE — Telephone Encounter (Signed)
Patient called- states that he is not having "bad symptoms" but still feel discomfort. He does not want to just take medication to help symptoms- he wants to know what is happening.   Patient wants imaging to make sure everything is okay. States this is the only thing that will make him feel like everything is ok.   Advised patient to go to ER for evaluation if he feels that he needs STAT imaging- patient declined and wants to just wait until provider in our office can order imaging. He does not feel symptoms are "that serious". I advised patient I would send note to his PCP and also the provider he had an appointment with but if any worsening or new symptoms, he is to go to ER. Patient understands. jess  Please advised if ordering a CT or MRI is appropriate for pt.

## 2019-12-18 NOTE — Telephone Encounter (Signed)
Patient called today for update. Advised patient I do not have response from provider yet.   Patient was very upset to know that the decision for an imaging order has to be from a physician because "this is my life and a vital organ". He states he needs imaging ASAP.   Explained to patient that any imaging is an order that has to come from a provider. Advised patient that if he feels this urgent, he needs to go to ED and be evaluated and see if they feel CT or MRI was necessary. He insists this isn't an emergency, but needs to be handled ASAP. I also let patient know that since this is her first occurence and there are not major complications, his insurance is not going to pay for advanced imaging.   Patient upset because he does not want to pay for imaging. Wants to know if there are other option. I advised patient I could ask provider if she is agreeable with referral to neurology for headache. Once again, patient was upset that a referral had to come from a provider and I couldn't "just do it". I advised patient he can research some neurologists and call them to see if he can get in without referral and he declined to do this.   Patient reported today that he actually is taking his medication but feels it is a Band-Aid to the issue. Admits the medication is actually helping some. He is upset he was not completely better after 1 dose.

## 2019-12-19 NOTE — Telephone Encounter (Signed)
Spoke with Dr Linford Arnold. Referral is also NOT appropriate. Patient is to complete prednisone course and then make virtual appointment with Dr Linford Arnold if still having symptoms. If new or worsening symptoms, patient is to go to ED for evaluation.   Called and advised patient. Patient was still very frustrated and upset that he could not get advanced imaging as a preventative measure. He expresses his concerns for having to have "life threatening symptoms" before he can go forward with imaging. I advised patient that providers must deem advanced imaging appropriate prior to ordering and that insurance requires a specific detailed report of what treatments were tried and failed prior to authorization.   Reminded patient that nothing was guaranteed. Made sure he understood that I solely scheduled him for a follow up in office visit (per his request) on Monday and this did not guarantee that advanced imaging nor referral was guaranteed to be ordered. The provider would evaluate and order what they feel is appropriate.   FYI to PCP

## 2019-12-22 ENCOUNTER — Ambulatory Visit (INDEPENDENT_AMBULATORY_CARE_PROVIDER_SITE_OTHER): Payer: BC Managed Care – PPO | Admitting: Family Medicine

## 2019-12-22 ENCOUNTER — Other Ambulatory Visit: Payer: Self-pay

## 2019-12-22 ENCOUNTER — Encounter: Payer: Self-pay | Admitting: Family Medicine

## 2019-12-22 VITALS — BP 118/56 | HR 77 | Ht 68.0 in | Wt 202.0 lb

## 2019-12-22 DIAGNOSIS — G4452 New daily persistent headache (NDPH): Secondary | ICD-10-CM

## 2019-12-22 NOTE — Progress Notes (Addendum)
Established Patient Office Visit  Subjective:  Patient ID: Brett Hall, male    DOB: 20-Nov-1971  Age: 49 y.o. MRN: [id number]  CC:  Chief Complaint  Patient presents with  . Headache    HPI Brett Hall presents for persistent headaches.  HA started around 12/07/2019.  He says they tend to move around sometimes more on the top of his head sometimes more in the back of his head sometimes it is more frontal.  Not usually over the maxillary sinus area.  Today it is more posterior and radiating down towards his neck.  He has been having a little bit of mid cervical pain and tightness from time to time but not severe.  He says he has got headaches in the past but normally he can take 2 Advil and it goes away immediately.  He is never had a time where the headache persisted for multiple days.  This is new for him.  He was actually waking up with a headache.  More recently we decided to try prednisone.  He says it did help some but he still having the headaches and discomfort.  He says is not severe enough to stop him from doing anything.  He is not noticed any extremity weakness.  No changes in speech, hearing or vision.  It does not get worse with activity.  He says that the pain is sometimes on the small like a pressure sensation.  He feels like he is hydrating well he works out regularly.  Though he did note he changed protein supplements recently which she puts on a daily shake that he drinks.  He feels like he gets enough rest.  He does not feel overly stressed or anxious right now.   Past Medical History:  Diagnosis Date  . Chest pain, atypical   . Femoral hernia   . GERD (gastroesophageal reflux disease)   . RBBB (right bundle branch block with left anterior fascicular block)    hemblock    Past Surgical History:  Procedure Laterality Date  . none      Family History  Problem Relation Age of Onset  . Coronary artery disease Unknown        No family history of premature CAD   . Diabetes Unknown     Social History   Socioeconomic History  . Marital status: Married    Spouse name: Not on file  . Number of children: Not on file  . Years of education: Not on file  . Highest education level: Not on file  Occupational History  . Occupation: Realtor  Tobacco Use  . Smoking status: Never Smoker  . Smokeless tobacco: Never Used  Substance and Sexual Activity  . Alcohol use: No    Comment: occasiona  . Drug use: No  . Sexual activity: Not on file  Other Topics Concern  . Not on file  Social History Narrative  . Not on file   Social Determinants of Health   Financial Resource Strain:   . Difficulty of Paying Living Expenses: Not on file  Food Insecurity:   . Worried About Programme researcher, broadcasting/film/video in the Last Year: Not on file  . Ran Out of Food in the Last Year: Not on file  Transportation Needs:   . Lack of Transportation (Medical): Not on file  . Lack of Transportation (Non-Medical): Not on file  Physical Activity:   . Days of Exercise per Week: Not on file  . Minutes of Exercise per  Session: Not on file  Stress:   . Feeling of Stress : Not on file  Social Connections:   . Frequency of Communication with Friends and Family: Not on file  . Frequency of Social Gatherings with Friends and Family: Not on file  . Attends Religious Services: Not on file  . Active Member of Clubs or Organizations: Not on file  . Attends Archivist Meetings: Not on file  . Marital Status: Not on file  Intimate Partner Violence:   . Fear of Current or Ex-Partner: Not on file  . Emotionally Abused: Not on file  . Physically Abused: Not on file  . Sexually Abused: Not on file    Outpatient Medications Prior to Visit  Medication Sig Dispense Refill  . omeprazole (PRILOSEC) 40 MG capsule Take 1 capsule (40 mg total) by mouth daily. (Patient not taking: Reported on 12/15/2019) 30 capsule 3   No facility-administered medications prior to visit.    Allergies   Allergen Reactions  . Citric Acid     ROS Review of Systems    Objective:    Physical Exam  Constitutional: He is oriented to person, place, and time. He appears well-developed and well-nourished.  HENT:  Head: Normocephalic and atraumatic.  Right Ear: External ear normal.  Left Ear: External ear normal.  Nose: Nose normal.  Mouth/Throat: Oropharynx is clear and moist.  TMs and canals are clear.   Eyes: Pupils are equal, round, and reactive to light. Conjunctivae and EOM are normal.  Neck: No thyromegaly present.  The posterior upper left neck he has a palpable small less than 1 cm lymph node.  He says it has been there since childhood and has not changed.  Cardiovascular: Normal rate, regular rhythm and normal heart sounds.  Pulmonary/Chest: Effort normal and breath sounds normal.  Musculoskeletal:     Cervical back: Neck supple.  Lymphadenopathy:    He has no cervical adenopathy.  Neurological: He is alert and oriented to person, place, and time. He has normal reflexes. He displays normal reflexes. No cranial nerve deficit. He exhibits normal muscle tone. Coordination normal.  Skin: Skin is warm and dry.  Psychiatric: He has a normal mood and affect. His behavior is normal.    BP (!) 118/56   Pulse 77   Ht 5\' 8"  (1.727 m)   Wt 202 lb (91.6 kg)   SpO2 100%   BMI 30.71 kg/m  Wt Readings from Last 3 Encounters:  12/22/19 202 lb (91.6 kg)  12/15/19 200 lb 1.3 oz (90.8 kg)  10/29/19 195 lb (88.5 kg)     There are no preventive care reminders to display for this patient.  There are no preventive care reminders to display for this patient.  Lab Results  Component Value Date   TSH 1.91 04/09/2019   Lab Results  Component Value Date   WBC 4.4 08/14/2019   HGB 14.0 08/14/2019   HCT 42.4 08/14/2019   MCV 89.3 08/14/2019   PLT 146 08/14/2019   Lab Results  Component Value Date   NA 137 08/14/2019   K 4.6 08/14/2019   CO2 27 08/14/2019   GLUCOSE 95  08/14/2019   BUN 19 08/14/2019   CREATININE 1.23 08/14/2019   BILITOT 0.3 08/14/2019   ALKPHOS 45 07/31/2016   AST 30 08/14/2019   ALT 23 08/14/2019   PROT 7.7 08/14/2019   ALBUMIN 4.2 07/31/2016   CALCIUM 9.7 08/14/2019   Lab Results  Component Value Date   CHOL  183 04/09/2019   Lab Results  Component Value Date   HDL 53 04/09/2019   Lab Results  Component Value Date   LDLCALC 110 (H) 04/09/2019   Lab Results  Component Value Date   TRIG 101 04/09/2019   Lab Results  Component Value Date   CHOLHDL 3.5 04/09/2019   Lab Results  Component Value Date   HGBA1C 5.5 10/29/2019      Assessment & Plan:   Problem List Items Addressed This Visit    None    Visit Diagnoses    Headache, new daily persistent ([facility])    -  Primary   Relevant Orders   MR Brain W Wo Contrast     Persistent daily headaches for 2 weeks very unlike his typical headache pattern.  Unclear etiology.  Though he did change protein supplements which she puts in his shakes every day.  We discussed the importance of getting adequate rest, hydrating well, continue with regular exercise.  He has been having some upper cervical pain as well which could be contributing.  Nothing to indicate acute stroke symptoms. And some headaches are triggered by neck pain.  He did get some improvement with the prednisone but it did not completely resolve the headaches so at this point we will move forward with imaging.  Nothing to indicate any underlying sinus infection, or potential for aneurysm.  Itching will be to rule out mass, stroke etc.  Okay to use Advil as needed but do not overuse as it can also cause a rebound phenomenon.  No orders of the defined types were placed in this encounter.   Follow-up: Return if symptoms worsen or fail to improve.    Nani Gasser, MD

## 2019-12-24 ENCOUNTER — Encounter: Payer: BC Managed Care – PPO | Admitting: Family Medicine

## 2019-12-29 ENCOUNTER — Other Ambulatory Visit: Payer: Self-pay

## 2019-12-29 ENCOUNTER — Ambulatory Visit (INDEPENDENT_AMBULATORY_CARE_PROVIDER_SITE_OTHER): Payer: BC Managed Care – PPO

## 2019-12-29 DIAGNOSIS — G4452 New daily persistent headache (NDPH): Secondary | ICD-10-CM

## 2019-12-29 DIAGNOSIS — R519 Headache, unspecified: Secondary | ICD-10-CM | POA: Diagnosis not present

## 2019-12-29 MED ORDER — GADOBUTROL 1 MMOL/ML IV SOLN
9.0000 mL | Freq: Once | INTRAVENOUS | Status: AC | PRN
  Administered 2019-12-29: 9 mL via INTRAVENOUS

## 2020-01-06 DIAGNOSIS — I861 Scrotal varices: Secondary | ICD-10-CM | POA: Diagnosis not present

## 2020-04-02 DIAGNOSIS — Z01818 Encounter for other preprocedural examination: Secondary | ICD-10-CM | POA: Diagnosis not present

## 2020-04-05 DIAGNOSIS — I861 Scrotal varices: Secondary | ICD-10-CM | POA: Insufficient documentation

## 2020-04-20 ENCOUNTER — Ambulatory Visit (INDEPENDENT_AMBULATORY_CARE_PROVIDER_SITE_OTHER): Payer: BC Managed Care – PPO | Admitting: Family Medicine

## 2020-04-20 ENCOUNTER — Encounter: Payer: Self-pay | Admitting: Family Medicine

## 2020-04-20 VITALS — BP 109/59 | HR 82 | Ht 68.0 in | Wt 200.0 lb

## 2020-04-20 DIAGNOSIS — Z23 Encounter for immunization: Secondary | ICD-10-CM

## 2020-04-20 DIAGNOSIS — M545 Low back pain, unspecified: Secondary | ICD-10-CM

## 2020-04-20 DIAGNOSIS — G43909 Migraine, unspecified, not intractable, without status migrainosus: Secondary | ICD-10-CM | POA: Insufficient documentation

## 2020-04-20 DIAGNOSIS — Z0000 Encounter for general adult medical examination without abnormal findings: Secondary | ICD-10-CM | POA: Diagnosis not present

## 2020-04-20 DIAGNOSIS — R519 Headache, unspecified: Secondary | ICD-10-CM | POA: Diagnosis not present

## 2020-04-20 NOTE — Assessment & Plan Note (Signed)
Often having headache a couple times a week that he describes as mild on the top of the head.  Nonthrobbing.  No specific triggers.  We discussed monitoring for neck pain or problems as a potential trigger.  He does not sound sinus related.  He is already had a negative brain imaging.  We discussed can trial of prophylaxis for frequent headaches.  Does not meet criteria for migraines at this point.

## 2020-04-20 NOTE — Patient Instructions (Signed)

## 2020-04-20 NOTE — Assessment & Plan Note (Signed)
Mostly just bothersome when he first gets up in the morning.  He has had low back pain before and previously saw one of our sports med docs back in 2013.  Right now not hearing any red flag symptoms.  It usually self-limited for about 20 minutes.  We discussed that it may be somewhat positional and how he is sleeping or if he is been doing a lot of lifting or activity the day before.  If it persists or becomes more frequent then we can do additional work-up in the meantime just work on gentle stretches.  Also work on some core strengthening since he does go to the gym pretty regularly.

## 2020-04-20 NOTE — Progress Notes (Signed)
CPE  Established Patient Office Visit  Subjective:  Patient ID: Brett Hall, male    DOB: Jun 07, 1971  Age: 49 y.o. MRN: [id number]  CC:  Chief Complaint  Patient presents with  . Annual Exam    HPI Brett Hall presents for CPE.   He also has a couple other concerns today.  He still having some headaches on the top of his head.  Please see prior notes.  It is mostly at the very top at the midline and slightly posterior.  He says most of the time its not throbbing.  He says it is more mild in intensity.  He denies any significant neck pain etc.  He has not noticed any specific triggers it can happen anytime.  He says he has not worked out since he had the varicocele surgery done recently.  He did want to let me know he had a couple episodes of sudden sharp pain in the prostate area both times it occurred while sitting.  He says moving around and try to get more comfortable did provide some relief.  He also reports some low back pain sometimes when he first wakes up in the morning it sometimes will do it for a few days in a row and then seem to ease off.  Is it almost feels like a tightness like it is hard to stand up straight.  It last for about 20 to 30 minutes and then eases off.  He never really takes medication for it.   Past Medical History:  Diagnosis Date  . Chest pain, atypical   . Femoral hernia   . GERD (gastroesophageal reflux disease)   . RBBB (right bundle branch block with left anterior fascicular block)    hemblock    Past Surgical History:  Procedure Laterality Date  . VARICOCELE EXCISION  2021    Family History  Problem Relation Age of Onset  . Coronary artery disease Unknown        No family history of premature CAD  . Diabetes Unknown     Social History   Socioeconomic History  . Marital status: Married    Spouse name: Not on file  . Number of children: Not on file  . Years of education: Not on file  . Highest education level: Not on file   Occupational History  . Occupation: Realtor  Tobacco Use  . Smoking status: Never Smoker  . Smokeless tobacco: Never Used  Substance and Sexual Activity  . Alcohol use: No    Comment: occasiona  . Drug use: No  . Sexual activity: Not on file  Other Topics Concern  . Not on file  Social History Narrative  . Not on file   Social Determinants of Health   Financial Resource Strain:   . Difficulty of Paying Living Expenses:   Food Insecurity:   . Worried About Programme researcher, broadcasting/film/video in the Last Year:   . Barista in the Last Year:   Transportation Needs:   . Freight forwarder (Medical):   Marland Kitchen Lack of Transportation (Non-Medical):   Physical Activity:   . Days of Exercise per Week:   . Minutes of Exercise per Session:   Stress:   . Feeling of Stress :   Social Connections:   . Frequency of Communication with Friends and Family:   . Frequency of Social Gatherings with Friends and Family:   . Attends Religious Services:   . Active Member of Clubs or Organizations:   .  Attends Archivist Meetings:   Marland Kitchen Marital Status:   Intimate Partner Violence:   . Fear of Current or Ex-Partner:   . Emotionally Abused:   Marland Kitchen Physically Abused:   . Sexually Abused:     No outpatient medications prior to visit.   No facility-administered medications prior to visit.    Allergies  Allergen Reactions  . Citric Acid     ROS Review of Systems    Objective:    Physical Exam  Constitutional: He is oriented to person, place, and time. He appears well-developed and well-nourished.  HENT:  Head: Normocephalic and atraumatic.  Right Ear: External ear normal.  Left Ear: External ear normal.  Nose: Nose normal.  Mouth/Throat: Oropharynx is clear and moist.  Eyes: Pupils are equal, round, and reactive to light. Conjunctivae and EOM are normal.  Neck: No thyromegaly present.  Cardiovascular: Normal rate, regular rhythm, normal heart sounds and intact distal pulses.   Pulmonary/Chest: Effort normal and breath sounds normal.  Abdominal: Soft. Bowel sounds are normal. He exhibits no distension and no mass. There is no abdominal tenderness. There is no rebound and no guarding.  Musculoskeletal:        General: Normal range of motion.     Cervical back: Normal range of motion and neck supple.  Lymphadenopathy:    He has no cervical adenopathy.  Neurological: He is alert and oriented to person, place, and time. He has normal reflexes.  Skin: Skin is warm and dry.  Psychiatric: He has a normal mood and affect. His behavior is normal. Judgment and thought content normal.    BP (!) 109/59   Pulse 82   Ht 5\' 8"  (1.727 m)   Wt 200 lb (90.7 kg)   SpO2 100%   BMI 30.41 kg/m  Wt Readings from Last 3 Encounters:  04/20/20 200 lb (90.7 kg)  12/22/19 202 lb (91.6 kg)  12/15/19 200 lb 1.3 oz (90.8 kg)     Health Maintenance Due  Topic Date Due  . COVID-19 Vaccine (1) Never done    There are no preventive care reminders to display for this patient.  Lab Results  Component Value Date   TSH 1.91 04/09/2019   Lab Results  Component Value Date   WBC 4.4 08/14/2019   HGB 14.0 08/14/2019   HCT 42.4 08/14/2019   MCV 89.3 08/14/2019   PLT 146 08/14/2019   Lab Results  Component Value Date   NA 137 08/14/2019   K 4.6 08/14/2019   CO2 27 08/14/2019   GLUCOSE 95 08/14/2019   BUN 19 08/14/2019   CREATININE 1.23 08/14/2019   BILITOT 0.3 08/14/2019   ALKPHOS 45 07/31/2016   AST 30 08/14/2019   ALT 23 08/14/2019   PROT 7.7 08/14/2019   ALBUMIN 4.2 07/31/2016   CALCIUM 9.7 08/14/2019   Lab Results  Component Value Date   CHOL 183 04/09/2019   Lab Results  Component Value Date   HDL 53 04/09/2019   Lab Results  Component Value Date   LDLCALC 110 (H) 04/09/2019   Lab Results  Component Value Date   TRIG 101 04/09/2019   Lab Results  Component Value Date   CHOLHDL 3.5 04/09/2019   Lab Results  Component Value Date   HGBA1C 5.5  10/29/2019      Assessment & Plan:   Problem List Items Addressed This Visit      Other   Nonintractable headache    Often having headache a couple times  a week that he describes as mild on the top of the head.  Nonthrobbing.  No specific triggers.  We discussed monitoring for neck pain or problems as a potential trigger.  He does not sound sinus related.  He is already had a negative brain imaging.  We discussed can trial of prophylaxis for frequent headaches.  Does not meet criteria for migraines at this point.      Low back pain    Mostly just bothersome when he first gets up in the morning.  He has had low back pain before and previously saw one of our sports med docs back in 2013.  Right now not hearing any red flag symptoms.  It usually self-limited for about 20 minutes.  We discussed that it may be somewhat positional and how he is sleeping or if he is been doing a lot of lifting or activity the day before.  If it persists or becomes more frequent then we can do additional work-up in the meantime just work on gentle stretches.  Also work on some core strengthening since he does go to the gym pretty regularly.       Other Visit Diagnoses    Wellness examination    -  Primary   Relevant Orders   CBC   COMPLETE METABOLIC PANEL WITH GFR   Lipid panel   PSA   Tdap vaccine greater than or equal to 7yo IM (Completed)   Need for tetanus, diphtheria, and acellular pertussis (Tdap) vaccine in patient of adolescent age or older       Relevant Orders   Tdap vaccine greater than or equal to 7yo IM (Completed)     Keep up a regular exercise program and make sure you are eating a healthy diet Try to eat 4 servings of dairy a day, or if you are lactose intolerant take a calcium with vitamin D daily.  Your vaccines are up to date.     No orders of the defined types were placed in this encounter.   Follow-up: Return in about 1 year (around 04/20/2021).    Nani Gasser, MD

## 2020-04-22 ENCOUNTER — Encounter: Payer: Self-pay | Admitting: Family Medicine

## 2020-04-22 DIAGNOSIS — R519 Headache, unspecified: Secondary | ICD-10-CM

## 2020-04-28 ENCOUNTER — Ambulatory Visit (INDEPENDENT_AMBULATORY_CARE_PROVIDER_SITE_OTHER): Payer: BC Managed Care – PPO | Admitting: Neurology

## 2020-04-28 ENCOUNTER — Encounter: Payer: Self-pay | Admitting: Neurology

## 2020-04-28 ENCOUNTER — Other Ambulatory Visit: Payer: Self-pay

## 2020-04-28 VITALS — BP 125/72 | HR 76 | Ht 71.0 in | Wt 198.0 lb

## 2020-04-28 DIAGNOSIS — R0683 Snoring: Secondary | ICD-10-CM | POA: Diagnosis not present

## 2020-04-28 DIAGNOSIS — R5383 Other fatigue: Secondary | ICD-10-CM

## 2020-04-28 DIAGNOSIS — R519 Headache, unspecified: Secondary | ICD-10-CM | POA: Diagnosis not present

## 2020-04-28 NOTE — Progress Notes (Signed)
Subjective:    Patient ID: Brett Hall is a 49 y.o. male.  HPI     Brett Foley, MD, PhD [facility] [address], [address] [address] [place], [place] [address]  Dear Dr. Linford Arnold,   I saw your patient, Brett Hall, plan you can request in my neurologic clinic today for initial consultation of his recurrent headaches.  The patient is unaccompanied today.  As you know, Brett Hall is a 49 year old right-handed gentleman with an underlying medical history of chest pain, reflux disease, right bundle branch block and mildly overweight state, who reports recurrent headaches for the past few months.  He does not have any obvious triggers or alleviating factors.  The headache quality can be dull and achy and sometimes pinching.  It affects different parts of the head, typically on the top and posterior part of the head, typically not in the frontal areas or temporal areas.  It is typically not debilitating and throbbing and he does not have any other accompaniments such as nausea or vomiting or photophobia or sonophobia.  He denies any sudden onset of one-sided weakness or numbness or tingling or droopy face or slurring of speech.  He does not have a prior history of migrainous headaches or recurrent tension headaches.  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  He does not smoke any cigarettes but smokes marijuana occasionally.  He does not drink alcohol on a regular basis, occasionally, socially.  He tries to get enough sleep, he exercises on a regular basis, he had a recent varicocele repair and had to have a break in his exercise routine but has recently restarted exercising without any problems, surgery went well.  He goes to bed around 1130 or midnight and rise time is between 7 and 730, he denies any night to night nocturia but has woken up with a headache occasionally.  He reports some snoring which is typically on the milder side  per girlfriend.  He lives with his girlfriend, no kids, 1 dog in the household.  He is not aware of any family history of OSA.  He had a brain MRI with and without contrast on 12/29/19 and I reviewed the results: IMPRESSION: Normal examination. No abnormality seen to explain the presenting symptoms.   He has not had a eye examination in the past year.  He denies any obvious blurry vision or double vision or loss of vision.  His Past Medical History Is Significant For: Past Medical History:  Diagnosis Date  . Chest pain, atypical   . Femoral hernia   . GERD (gastroesophageal reflux disease)   . RBBB (right bundle branch block with left anterior fascicular block)    hemblock    His Past Surgical History Is Significant For: Past Surgical History:  Procedure Laterality Date  . VARICOCELE EXCISION  2021    His Family History Is Significant For: Family History  Problem Relation Age of Onset  . Coronary artery disease Unknown        No family history of premature CAD  . Diabetes Unknown     His Social History Is Significant For: Social History   Socioeconomic History  . Marital status: Married    Spouse name: Not on file  . Number of children: Not on file  . Years of education: Not on file  . Highest education level: Not on file  Occupational History  . Occupation: Realtor  Tobacco Use  . Smoking status:  Never Smoker  . Smokeless tobacco: Never Used  Substance and Sexual Activity  . Alcohol use: No    Comment: occasiona  . Drug use: No  . Sexual activity: Not on file  Other Topics Concern  . Not on file  Social History Narrative  . Not on file   Social Determinants of Health   Financial Resource Strain:   . Difficulty of Paying Living Expenses:   Food Insecurity:   . Worried About Charity fundraiser in the Last Year:   . Arboriculturist in the Last Year:   Transportation Needs:   . Film/video editor (Medical):   Marland Kitchen Lack of Transportation (Non-Medical):    Physical Activity:   . Days of Exercise per Week:   . Minutes of Exercise per Session:   Stress:   . Feeling of Stress :   Social Connections:   . Frequency of Communication with Friends and Family:   . Frequency of Social Gatherings with Friends and Family:   . Attends Religious Services:   . Active Member of Clubs or Organizations:   . Attends Archivist Meetings:   Marland Kitchen Marital Status:     His Allergies Are:  Allergies  Allergen Reactions  . Citric Acid   :   His Current Medications Are:  No outpatient encounter medications on file as of 04/28/2020.   No facility-administered encounter medications on file as of 04/28/2020.  :   Review of Systems:  Out of a complete 14 point review of systems, all are reviewed and negative with the exception of these symptoms as listed below:  Review of Systems  Neurological:       Here for consult on worsening H/a. Pt reports he is not currently taking any prescription or otc meds.  Epworth Sleepiness Scale 0= would never doze 1= slight chance of dozing 2= moderate chance of dozing 3= high chance of dozing  Sitting and reading:0 Watching TV:1 Sitting inactive in a public place (ex. Theater or meeting):1 As a passenger in a car for an hour without a break:1 Lying down to rest in the afternoon:3 Sitting and talking to someone:0 Sitting quietly after lunch (no alcohol):1 In a car, while stopped in traffic:0 Total:7     Objective:  Neurological Exam  Physical Exam Physical Examination:   Vitals:   04/28/20 1048  BP: 125/72  Pulse: 76    General Examination: The patient is a very pleasant 49 y.o. male in no acute distress. He appears well-developed and well-nourished and well groomed.   HEENT: Normocephalic, atraumatic, pupils are equal, round and reactive to light and accommodation. Funduscopic exam is normal with sharp disc margins noted. Extraocular tracking is good without limitation to gaze excursion or  nystagmus noted. Normal smooth pursuit is noted. Hearing is grossly intact. Face is symmetric with normal facial animation and normal facial sensation. Speech is clear with no dysarthria noted. There is no hypophonia. There is no lip, neck/head, jaw or voice tremor. Neck is supple with full range of passive and active motion. There are no carotid bruits on auscultation. Oropharynx exam reveals: mild mouth dryness, good dental hygiene and mild airway crowding, due to smaller airway entry, elongated uvula, tonsils of 1+ bilaterally.  Mallampati is class II, neck circumference of 16 and three-quarter inches.  He has a minimal overbite.  Tongue protrudes centrally in palate elevates symmetrically.  Chest: Clear to auscultation without wheezing, rhonchi or crackles noted.  Heart: S1+S2+0, regular and normal  without murmurs, rubs or gallops noted.   Abdomen: Soft, non-tender and non-distended with normal bowel sounds appreciated on auscultation.  Extremities: There is no pitting edema in the distal lower extremities bilaterally. Pedal pulses are intact.  Skin: Warm and dry without trophic changes noted.  Musculoskeletal: exam reveals no obvious joint deformities, tenderness or joint swelling or erythema.   Neurologically:  Mental status: The patient is awake, alert and oriented in all 4 spheres. His immediate and remote memory, attention, language skills and fund of knowledge are appropriate. There is no evidence of aphasia, agnosia, apraxia or anomia. Speech is clear with normal prosody and enunciation. Thought process is linear. Mood is normal and affect is normal.  Cranial nerves II - XII are as described above under HEENT exam. In addition: shoulder shrug is normal with equal shoulder height noted. Motor exam: Normal bulk, strength and tone is noted. There is no drift, tremor or rebound. Romberg is negative. Reflexes are 2+ throughout. Babinski: Toes are flexor bilaterally. Fine motor skills and  coordination: intact with normal finger taps, normal hand movements, normal rapid alternating patting, normal foot taps and normal foot agility.  Cerebellar testing: No dysmetria or intention tremor on finger to nose testing. Heel to shin is unremarkable bilaterally. There is no truncal or gait ataxia.  Sensory exam: intact to light touch in the upper and lower extremities.  Gait, station and balance: He stands easily. No veering to one side is noted. No leaning to one side is noted. Posture is age-appropriate and stance is narrow based. Gait shows normal stride length and normal pace. No problems turning are noted. Tandem walk is unremarkable.   Assessment and Plan:   In summary, Brett Hall is a very pleasant 49 y.o.-year old male with an underlying medical history of chest pain, reflux disease, right bundle branch block and mildly overweight state, who presents for evaluation his recurrent headaches of several months duration.  He has a nondebilitating headache, sometimes an achy sensation, sometimes a pinching sensation in various areas of his head, most typically in the back on top of the head, not so much in the frontal areas or temporal areas.  Description of the headache is not classic for any typical headache syndrome such as cluster headache or tension type headache or migraines.  His neurological exam is normal and he is largely reassured today, he also had a recent rain MRI in January of this year.  He is advised however to proceed with a routine eye examination with an optometrist of his choosing since he has not had an eye exam in at least 1 year.  In addition, I think it would be worthwhile doing a sleep study to rule out underlying obstructive sleep apnea as he does snore to some degree and has woken up at times with a headache.  He is agreeable to this approach.  We mutually agreed not to start any migraine or headache preventative medication.  He typically does not even take any  over-the-counter medication for his symptoms.  He really does not have any red flags on exam or by history.  He is advised that we will call him to schedule his sleep study and also with the results when they are available.  We will follow-up in this clinic if needed.  I answered all his questions today and he was in agreement.    Thank you very much for allowing me to participate in the care of this nice patient. If I can be  of any further assistance to you please do not hesitate to call me at [phone].  Sincerely,   Star Age, MD, PhD

## 2020-04-28 NOTE — Patient Instructions (Signed)
It was nice to meet you today.  Your recent MRI was normal.  Your neurological exam is normal.  I would like to suggest a couple of things: 1.  Let's proceed with a sleep study to rule out obstructive sleep apnea.  Untreated obstructive sleep apnea can cause recurrent headaches. Generally speaking, the long-term risks and ramifications of untreated moderate to severe obstructive sleep apnea are: increased Cardiovascular disease, including congestive heart failure, stroke, difficult to control hypertension, treatment resistant obesity, arrhythmias, especially irregular heartbeat commonly known as A. Fib. (atrial fibrillation); even type 2 diabetes has been linked to untreated OSA.   Sleep apnea can cause disruption of sleep and sleep deprivation in most cases, which, in turn, can cause recurrent headaches, problems with memory, mood, concentration, focus, and vigilance. Most people with untreated sleep apnea report excessive daytime sleepiness, which can affect their ability to drive. Please do not drive if you feel sleepy. Patients with sleep apnea developed difficulty initiating and maintaining sleep (aka insomnia).   The sleep lab staff will take care of the insurance authorization for testing and call you to schedule your sleep study.    2.  Please schedule a routine eye examination within the optometrist of your choosing. 3.  Please consider stopping some of your over-the-counter supplements, it would be helpful to stop 1 at a time or stop all of them and add one at a time back to see if it makes a difference in your headache pattern. 4.  Try to continue to limit your caffeine intake, stay well rested, well hydrated with water.  5. We can schedule a follow-up after your sleep study, we will also call you with the results.

## 2020-08-10 DIAGNOSIS — R079 Chest pain, unspecified: Secondary | ICD-10-CM | POA: Diagnosis not present

## 2020-08-10 DIAGNOSIS — R0789 Other chest pain: Secondary | ICD-10-CM | POA: Diagnosis not present

## 2020-09-21 ENCOUNTER — Encounter: Payer: Self-pay | Admitting: Medical-Surgical

## 2020-09-21 ENCOUNTER — Ambulatory Visit (INDEPENDENT_AMBULATORY_CARE_PROVIDER_SITE_OTHER): Payer: BC Managed Care – PPO | Admitting: Medical-Surgical

## 2020-09-21 VITALS — BP 113/81 | HR 94 | Temp 97.9°F | Ht 71.0 in | Wt 199.2 lb

## 2020-09-21 DIAGNOSIS — R519 Headache, unspecified: Secondary | ICD-10-CM

## 2020-09-21 NOTE — Progress Notes (Signed)
Subjective:    CC: pounding headache  HPI: Pleasant 49 year old male presenting today with complaints of a headache. Today, his headache is resolved and he presents to be proactive and make sure there is nothing concerning that needs further investigation. He reports he is going to be a father and his fiance is [redacted] weeks pregnant with a baby girl. He is very excited about this and feels that his anxiety and stress are at a very good point. Was supposed to have a sleep study but had to travel out of town for a family issue and missed his appointment.   HEADACHE   Onset: over the weekend, present on awakening on Saturday morning  Location: left side of the head, travels a little  Quality: pounding Frequency: intermittent Precipitating factors: none identified  Prior treatment: Advil sinus was helpful, Tylenol/Advil alone not helpful  Associated Symptoms Nausea/vomiting: no  Photophobia/phonophobia: no  Tearing of eyes: no  Sinus pain/pressure: no  Family hx migraine: yes, mother  Personal stressors: yes, well managed at this time   Relation to menstrual cycle: NA   Red Flags Fever: no  Neck pain/stiffness: no  Vision/speech/swallow/hearing difficulty: no  Focal weakness/numbness: no  Altered mental status: no  Trauma: no  New type of headache: no  Anticoagulant use: no  H/o cancer/HIV/Pregnancy: no   I reviewed the past medical history, family history, social history, surgical history, and allergies today and no changes were needed.  Please see the problem list section below in epic for further details.  Past Medical History: Past Medical History:  Diagnosis Date  . Chest pain, atypical   . Femoral hernia   . GERD (gastroesophageal reflux disease)   . RBBB (right bundle branch block with left anterior fascicular block)    hemblock   Past Surgical History: Past Surgical History:  Procedure Laterality Date  . VARICOCELE EXCISION  2021   Social History: Social History    Socioeconomic History  . Marital status: Married    Spouse name: Not on file  . Number of children: Not on file  . Years of education: Not on file  . Highest education level: Not on file  Occupational History  . Occupation: Realtor  Tobacco Use  . Smoking status: Never Smoker  . Smokeless tobacco: Never Used  Vaping Use  . Vaping Use: Never used  Substance and Sexual Activity  . Alcohol use: No    Comment: occasiona  . Drug use: No  . Sexual activity: Not on file  Other Topics Concern  . Not on file  Social History Narrative  . Not on file   Social Determinants of Health   Financial Resource Strain:   . Difficulty of Paying Living Expenses: Not on file  Food Insecurity:   . Worried About Programme researcher, broadcasting/film/video in the Last Year: Not on file  . Ran Out of Food in the Last Year: Not on file  Transportation Needs:   . Lack of Transportation (Medical): Not on file  . Lack of Transportation (Non-Medical): Not on file  Physical Activity:   . Days of Exercise per Week: Not on file  . Minutes of Exercise per Session: Not on file  Stress:   . Feeling of Stress : Not on file  Social Connections:   . Frequency of Communication with Friends and Family: Not on file  . Frequency of Social Gatherings with Friends and Family: Not on file  . Attends Religious Services: Not on file  . Active Member  of Clubs or Organizations: Not on file  . Attends Banker Meetings: Not on file  . Marital Status: Not on file   Family History: Family History  Problem Relation Age of Onset  . Coronary artery disease Unknown        No family history of premature CAD  . Diabetes Unknown    Allergies: Allergies  Allergen Reactions  . Citric Acid    Medications: See med rec.  Review of Systems: See HPI for pertinent positives and negatives.   Objective:    General: Well Developed, well nourished, and in no acute distress.  Neuro: Alert and oriented x3.  HEENT: Normocephalic,  atraumatic.  Skin: Warm and dry. Cardiac: Regular rate and rhythm, no murmurs rubs or gallops, no lower extremity edema.  Respiratory: Clear to auscultation bilaterally. Not using accessory muscles, speaking in full sentences.   Impression and Recommendations:    1. Nonintractable headache, unspecified chronicity pattern, unspecified headache type No headache present today. Discussed concerning symptoms to monitor for during a headache and to seek medical care for headaches that do not respond to OTC treatment or are accompanied by neurological deficits, fever, or severe neck pain/stiffness. With his family history and current description of his headache this weekend, it sounds somewhat like a migraine. Ok to continue Advil Sinus if this is helpful but also consider Excedrin migraine. Plan to follow up as scheduled with PCP. Contact information given for Sleep Center and patient encouraged to reschedule his sleep study.  Return if symptoms worsen or fail to improve. ___________________________________________ Thayer Ohm, DNP, APRN, FNP-BC Primary Care and Sports Medicine [facility]

## 2020-09-21 NOTE — Patient Instructions (Signed)
General Headache Without Cause A headache is pain or discomfort felt around the head or neck area. The specific cause of a headache may not be found. There are many causes and types of headaches. A few common ones are:  Tension headaches.  Migraine headaches.  Cluster headaches.  Chronic daily headaches. Follow these instructions at home: Watch your condition for any changes. Let your health care provider know about them. Take these steps to help with your condition: Managing pain      Take over-the-counter and prescription medicines only as told by your health care provider.  Lie down in a dark, quiet room when you have a headache.  If directed, put ice on your head and neck area: ? Put ice in a plastic bag. ? Place a towel between your skin and the bag. ? Leave the ice on for 20 minutes, 2-3 times per day.  If directed, apply heat to the affected area. Use the heat source that your health care provider recommends, such as a moist heat pack or a heating pad. ? Place a towel between your skin and the heat source. ? Leave the heat on for 20-30 minutes. ? Remove the heat if your skin turns bright red. This is especially important if you are unable to feel pain, heat, or cold. You may have a greater risk of getting burned.  Keep lights dim if bright lights bother you or make your headaches worse. Eating and drinking  Eat meals on a regular schedule.  If you drink alcohol: ? Limit how much you use to:  0-1 drink a day for women.  0-2 drinks a day for men. ? Be aware of how much alcohol is in your drink. In the [place] one drink equals one 12 oz bottle of beer (355 mL), one 5 oz glass of wine (148 mL), or one 1 oz glass of hard liquor (44 mL).  Stop drinking caffeine, or decrease the amount of caffeine you drink. General instructions   Keep a headache journal to help find out what may trigger your headaches. For example, write down: ? What you eat and drink. ? How much  sleep you get. ? Any change to your diet or medicines.  Try massage or other relaxation techniques.  Limit stress.  Sit up straight, and do not tense your muscles.  Do not use any products that contain nicotine or tobacco, such as cigarettes, e-cigarettes, and chewing tobacco. If you need help quitting, ask your health care provider.  Exercise regularly as told by your health care provider.  Sleep on a regular schedule. Get 7-9 hours of sleep each night, or the amount recommended by your health care provider.  Keep all follow-up visits as told by your health care provider. This is important. Contact a health care provider if:  Your symptoms are not helped by medicine.  You have a headache that is different from the usual headache.  You have nausea or you vomit.  You have a fever. Get help right away if:  Your headache becomes severe quickly.  Your headache gets worse after moderate to intense physical activity.  You have repeated vomiting.  You have a stiff neck.  You have a loss of vision.  You have problems with speech.  You have pain in the eye or ear.  You have muscular weakness or loss of muscle control.  You lose your balance or have trouble walking.  You feel faint or pass out.  You have confusion.    You have a seizure. Summary  A headache is pain or discomfort felt around the head or neck area.  There are many causes and types of headaches. In some cases, the cause may not be found.  Keep a headache journal to help find out what may trigger your headaches. Watch your condition for any changes. Let your health care provider know about them.  Contact a health care provider if you have a headache that is different from the usual headache, or if your symptoms are not helped by medicine.  Get help right away if your headache becomes severe, you vomit, you have a loss of vision, you lose your balance, or you have a seizure. This information is not  intended to replace advice given to you by your health care provider. Make sure you discuss any questions you have with your health care provider. Document Revised: 06/10/2018 Document Reviewed: 06/10/2018 Elsevier Patient Education  2020 [employer].   [facility] [phone]

## 2020-09-28 ENCOUNTER — Emergency Department (HOSPITAL_COMMUNITY): Payer: BC Managed Care – PPO

## 2020-09-28 ENCOUNTER — Emergency Department (HOSPITAL_COMMUNITY)
Admission: EM | Admit: 2020-09-28 | Discharge: 2020-09-28 | Disposition: A | Discharge status: Home / Self Care | Payer: BC Managed Care – PPO | Attending: Emergency Medicine | Admitting: Emergency Medicine

## 2020-09-28 ENCOUNTER — Encounter (HOSPITAL_COMMUNITY): Payer: Self-pay

## 2020-09-28 ENCOUNTER — Other Ambulatory Visit: Payer: Self-pay

## 2020-09-28 DIAGNOSIS — R0989 Other specified symptoms and signs involving the circulatory and respiratory systems: Secondary | ICD-10-CM

## 2020-09-28 DIAGNOSIS — R221 Localized swelling, mass and lump, neck: Secondary | ICD-10-CM | POA: Diagnosis not present

## 2020-09-28 LAB — BASIC METABOLIC PANEL
Anion gap: 10 (ref 5–15)
BUN: 17 mg/dL (ref 6–20)
CO2: 26 mmol/L (ref 22–32)
Calcium: 9.4 mg/dL (ref 8.9–10.3)
Chloride: 100 mmol/L (ref 98–111)
Creatinine, Ser: 1.11 mg/dL (ref 0.61–1.24)
GFR, Estimated: >60 mL/min (ref >60)
Glucose, Bld: 102 mg/dL — ABNORMAL HIGH (ref 70–99)
Potassium: 4.1 mmol/L (ref 3.5–5.1)
Sodium: 136 mmol/L (ref 135–145)

## 2020-09-28 MED ORDER — IOHEXOL 300 MG/ML  SOLN
100.0000 mL | Freq: Once | INTRAMUSCULAR | Status: AC | PRN
  Administered 2020-09-28: 100 mL via INTRAVENOUS

## 2020-09-28 MED ORDER — OMEPRAZOLE 20 MG PO CPDR
20.0000 mg | DELAYED_RELEASE_CAPSULE | Freq: Every day | ORAL | 0 refills | Status: DC
Start: 2020-09-28 — End: 2020-10-20

## 2020-09-28 NOTE — ED Notes (Signed)
Berle Mull, PA aware pt wishes to speak to him before he is discharged.

## 2020-09-28 NOTE — ED Notes (Signed)
Patient transported to CT 

## 2020-09-28 NOTE — ED Notes (Signed)
Per Dr. Rosalia Hammers new order to follow. Pt not to be discharged yet.

## 2020-09-28 NOTE — ED Triage Notes (Signed)
Pt presents with c/o bump on the right side of his neck. Pt reports he first noticed it last week. Pt denies any pain to that area.

## 2020-09-28 NOTE — Discharge Instructions (Signed)
Seen here for a lump on the neck.  Exam looks reassuring.  I prescribed you an acid pill please take as prescribed.  I recommend follow-up with your PCP for further evaluation management.  Come back to the emergency department if you develop chest pain, shortness of breath, severe abdominal pain, uncontrolled nausea, vomiting, diarrhea.

## 2020-09-28 NOTE — ED Provider Notes (Addendum)
[facility]-EMERGENCY DEPT Provider Note   CSN: [id number] Arrival date & time: 09/28/20  1801     History Chief Complaint  Patient presents with  . Bump on neck    Brett Hall is a 49 y.o. male.  HPI   Patient with significant medical history of GERD presents to the emergency department with chief complaint of right-sided neck mass.  Patient states last week he thinks he noticed a lump on his neck.  He states he feels likes something gets stuck on that side when he eats..  He denies any actual pain or discomfort, he states he is able to eat and drink without any difficulty.  He denies regurgitating of his food.  He denies neck pain or torticollis.  He denies heart palpitations, fatigue, hair loss, anxious, or noticing any swelling around his neck.  He has no history of malignancy in his family, denies night sweats, fevers, unexplained weight loss or weight gain, bone pain.  Patient states he has an appointment with his PCP this Thursday for evaluation.  Patient denies headache, fever, chills, shortness of breath, chest pain, dumping, nausea, vomiting, diarrhea, pedal edema.  Past Medical History:  Diagnosis Date  . Chest pain, atypical   . Femoral hernia   . GERD (gastroesophageal reflux disease)   . RBBB (right bundle branch block with left anterior fascicular block)    hemblock    Patient Active Problem List   Diagnosis Date Noted  . Nonintractable headache 04/20/2020  . Varicocele 04/05/2020  . Pelvic pain in male 10/29/2019  . Elevated CK 08/15/2019  . Solitary lung nodule 08/15/2019  . IFG (impaired fasting glucose) 05/14/2018  . ED (erectile dysfunction) 08/05/2015  . Enlarged prostate 04/10/2014  . Low back pain 11/22/2012  . Gastroesophageal reflux disease 02/22/2011  . BUNDLE BRANCH BLOCK, RIGHT HEMIBLOCK 02/06/2011    Past Surgical History:  Procedure Laterality Date  . VARICOCELE EXCISION  2021       Family History  Problem  Relation Age of Onset  . Coronary artery disease Other        No family history of premature CAD  . Diabetes Other     Social History   Tobacco Use  . Smoking status: Never Smoker  . Smokeless tobacco: Never Used  Vaping Use  . Vaping Use: Never used  Substance Use Topics  . Alcohol use: No    Comment: occasiona  . Drug use: No    Home Medications Prior to Admission medications   Medication Sig Start Date End Date Taking? Authorizing Provider  omeprazole (PRILOSEC) 20 MG capsule Take 1 capsule (20 mg total) by mouth daily for 21 days. 09/28/20 10/19/20  Carroll Sage, PA-C    Allergies    Citric acid  Review of Systems   Review of Systems  Constitutional: Negative for chills and fever.  HENT: Negative for congestion, tinnitus, trouble swallowing and voice change.   Eyes: Negative for visual disturbance.  Respiratory: Negative for shortness of breath.   Cardiovascular: Negative for chest pain.  Gastrointestinal: Negative for abdominal pain, rectal pain and vomiting.  Genitourinary: Negative for dysuria, enuresis, flank pain and frequency.  Musculoskeletal: Negative for back pain.  Skin: Negative for rash.  Neurological: Negative for dizziness and headaches.  Hematological: Does not bruise/bleed easily.    Physical Exam Updated Vital Signs BP 140/84 (BP Location: Left Arm)   Pulse 72   Temp 98.7 F (37.1 C) (Oral)   Resp 16   SpO2  100%   Physical Exam Vitals and nursing note reviewed.  Constitutional:      General: He is not in acute distress.    Appearance: Normal appearance. He is not ill-appearing or diaphoretic.  HENT:     Head: Normocephalic and atraumatic.     Nose: No congestion or rhinorrhea.     Mouth/Throat:     Pharynx: No oropharyngeal exudate or posterior oropharyngeal erythema.  Eyes:     General: No scleral icterus.       Right eye: No discharge.        Left eye: No discharge.     Conjunctiva/sclera: Conjunctivae normal.  Neck:      Vascular: No carotid bruit.     Comments: Patient's neck was evaluated, there is no gross abnormalities noted.  Area was palpated there is no swelling noted, thyroid did not appear to be enlarged, there is no noted masses or lesions palpated. Pulmonary:     Effort: Pulmonary effort is normal.     Breath sounds: Normal breath sounds.  Musculoskeletal:     Cervical back: Normal range of motion and neck supple. No rigidity or tenderness.     Right lower leg: No edema.     Left lower leg: No edema.  Lymphadenopathy:     Cervical: No cervical adenopathy.  Skin:    General: Skin is warm and dry.     Coloration: Skin is not jaundiced or pale.  Neurological:     Mental Status: He is alert and oriented to person, place, and time.  Psychiatric:        Mood and Affect: Mood normal.     ED Results / Procedures / Treatments   Labs (all labs ordered are listed, but only abnormal results are displayed) Labs Reviewed - No data to display  EKG None  Radiology No results found.  Procedures Procedures (including critical care time)  Medications Ordered in ED Medications - No data to display  ED Course  I have reviewed the triage vital signs and the nursing notes.  Pertinent labs & imaging results that were available during my care of the patient were reviewed by me and considered in my medical decision making (see chart for details).    MDM Rules/Calculators/A&P                          I have personally reviewed all imaging, labs and have interpreted them.  Patient presents with concerns of right-sided neck mass.  He is alert, did not appear in acute distress, vital signs reassuring.  Due to nontoxic-appearing, reassuring but physical exam further lab and imaging were not warranted at this time.  I have low suspicion for thyroid storm or hyper thyroidism as patient has no systemic systems like heart palpitations, increased anxiety, hair loss, inability to sleep, no enlarged thyroid  felt on exam.  Low suspicion for food bolus as patient states he is able to eat and drink with difficulty, denies any regurgitation.  Low suspicion for carotid abnormality as area is palpated no bulging mass palpated, no bruits heard.  Differential diagnosis includes acid reflux, esophageal stricture, muscular strain will refer patient to PCP for further evaluation management.  Nursing staff came and notified me that patient wanted to talk to me before he was discharged. He had his sister on the phone and she was adamant that patient needs to have further imaging due to concern for possible life-threatening injury. I explained to  them at length there were no  findings on exam indicating further lab work or imaging. I explained the risks of radiation and and how there is no indication for further work up but patient  is still adamant about further imaging. I spoke with Dr. Rosalia Hammers who spoke with the patient and they again requested further imaging.  Will order BMP and order with and without contrast CT of neck for further evaluation.  Due to shift change patient will handed Mia Mcdondald PAC she is provided HPI, current work-up, likely disposition.  Pending imaging,  if negative will discharge him home with PPIs and follow-up with PT PCP for further evaluation.   Final Clinical Impression(s) / ED Diagnoses Final diagnoses:  Lump on neck    Rx / DC Orders ED Discharge Orders         Ordered    omeprazole (PRILOSEC) 20 MG capsule  Daily        09/28/20 1858           Carroll Sage, PA-C 09/28/20 1913    Carroll Sage, PA-C 09/28/20 2207    Margarita Grizzle, MD 09/28/20 2316

## 2020-09-30 ENCOUNTER — Ambulatory Visit (INDEPENDENT_AMBULATORY_CARE_PROVIDER_SITE_OTHER): Payer: BC Managed Care – PPO | Admitting: Medical-Surgical

## 2020-09-30 DIAGNOSIS — Z5329 Procedure and treatment not carried out because of patient's decision for other reasons: Secondary | ICD-10-CM

## 2020-09-30 NOTE — Progress Notes (Signed)
   Complete physical exam  Patient: Brett Hall   DOB: 09/23/1997   49 y.o. Male  MRN: [id number]  Subjective:    No chief complaint on file.   Brett Hall is a 49 y.o. male who presents today for a complete physical exam. She reports consuming a {diet types:17450} diet. {types:19826} She generally feels {DESC; WELL/FAIRLY WELL/POORLY:18703}. She reports sleeping {DESC; WELL/FAIRLY WELL/POORLY:18703}. She {does/does not:200015} have additional problems to discuss today.    Most recent fall risk assessment:    05/31/2022   10:42 AM  Fall Risk   Falls in the past year? 0  Number falls in past yr: 0  Injury with Fall? 0  Risk for fall due to : No Fall Risks  Follow up Falls evaluation completed     Most recent depression screenings:    05/31/2022   10:42 AM 04/21/2021   10:46 AM  PHQ 2/9 Scores  PHQ - 2 Score 0 0  PHQ- 9 Score 5     {VISON DENTAL STD PSA (Optional):27386}  {History (Optional):23778}  Patient Care Team: Tresia Revolorio, NP as PCP - General (Nurse Practitioner)   Outpatient Medications Prior to Visit  Medication Sig   fluticasone (FLONASE) 50 MCG/ACT nasal spray Place 2 sprays into both nostrils in the morning and at bedtime. After 7 days, reduce to once daily.   norgestimate-ethinyl estradiol (SPRINTEC 28) 0.25-35 MG-MCG tablet Take 1 tablet by mouth daily.   Nystatin POWD Apply liberally to affected area 2 times per day   spironolactone (ALDACTONE) 100 MG tablet Take 1 tablet (100 mg total) by mouth daily.   No facility-administered medications prior to visit.    ROS        Objective:     There were no vitals taken for this visit. {Vitals History (Optional):23777}  Physical Exam   No results found for any visits on 07/06/22. {Show previous labs (optional):23779}    Assessment & Plan:    Routine Health Maintenance and Physical Exam  Immunization History  Administered Date(s) Administered   DTaP 12/07/1999, 02/02/2000,  04/12/2000, 12/27/2000, 07/12/2004   Hepatitis A 05/08/2008, 05/14/2009   Hepatitis B 09/24/1999, 11/01/1999, 04/12/2000   HiB (PRP-OMP) 12/07/1999, 02/02/2000, 04/12/2000, 12/27/2000   IPV 12/07/1999, 02/02/2000, 10/01/2000, 07/12/2004   Influenza,inj,Quad PF,6+ Mos 08/14/2014   Influenza-Unspecified 11/13/2012   MMR 10/01/2001, 07/12/2004   Meningococcal Polysaccharide 05/13/2012   Pneumococcal Conjugate-13 12/27/2000   Pneumococcal-Unspecified 04/12/2000, 06/26/2000   Tdap 05/13/2012   Varicella 10/01/2000, 05/08/2008    Health Maintenance  Topic Date Due   HIV Screening  Never done   Hepatitis C Screening  Never done   INFLUENZA VACCINE  07/04/2022   PAP-Cervical Cytology Screening  07/06/2022 (Originally 09/22/2020)   PAP SMEAR-Modifier  07/06/2022 (Originally 09/22/2020)   TETANUS/TDAP  07/06/2022 (Originally 05/13/2022)   HPV VACCINES  Discontinued   COVID-19 Vaccine  Discontinued    Discussed health benefits of physical activity, and encouraged her to engage in regular exercise appropriate for her age and condition.  Problem List Items Addressed This Visit   None Visit Diagnoses     Annual physical exam    -  Primary   Cervical cancer screening       Need for Tdap vaccination          No follow-ups on file.     Starlynn Klinkner, NP   

## 2020-10-20 ENCOUNTER — Encounter: Payer: Self-pay | Admitting: Family Medicine

## 2020-10-20 ENCOUNTER — Ambulatory Visit (INDEPENDENT_AMBULATORY_CARE_PROVIDER_SITE_OTHER): Payer: BC Managed Care – PPO | Admitting: Family Medicine

## 2020-10-20 VITALS — BP 117/64 | HR 81 | Ht 71.0 in | Wt 204.0 lb

## 2020-10-20 DIAGNOSIS — G5601 Carpal tunnel syndrome, right upper limb: Secondary | ICD-10-CM

## 2020-10-20 DIAGNOSIS — Z0000 Encounter for general adult medical examination without abnormal findings: Secondary | ICD-10-CM | POA: Diagnosis not present

## 2020-10-20 DIAGNOSIS — M542 Cervicalgia: Secondary | ICD-10-CM

## 2020-10-20 DIAGNOSIS — M25531 Pain in right wrist: Secondary | ICD-10-CM | POA: Diagnosis not present

## 2020-10-20 MED ORDER — MELOXICAM 15 MG PO TABS
15.0000 mg | ORAL_TABLET | Freq: Every day | ORAL | 0 refills | Status: DC | PRN
Start: 2020-10-20 — End: 2021-02-18

## 2020-10-20 NOTE — Patient Instructions (Signed)
Please wear your brace at night for the next 3 weeks.  If at that point the numbness and tingling are not improving and becoming less frequent then please let me know. Please take the meloxicam once daily with a little bit of food for 7 days.  After that you can just take it as needed. OK to ice your wrist if needed.

## 2020-10-20 NOTE — Progress Notes (Signed)
Established Patient Office Visit  Subjective:  Patient ID: Brett Hall, male    DOB: June 26, 1971  Age: 49 y.o. MRN: [id number]  CC:  Chief Complaint  Patient presents with  . Numbness    R index and thumb  . Wrist Pain  . throat fullness    HPI Brett Hall presents for   F/U ED visit lump in throat/globus sensation.  He was actually seen in the emergency department about 3 weeks ago on October 26.  Ports that the symptoms started about 3 days prior before he went.  He felt like there was a sensation in the right anterior aspect of his neck.  He noticed it when he would swallow.  He had a BMP that was normal.  As well as a CT of the soft tissues of the neck.  There were no enlarged lymph nodes or thyroid abnormalities.  Essentially normal CT.   Having right wrist pain -using it more when he flexes his wrist to reach into his pocket.  No swelling or trauma or injury that he is aware of.  Also having numbness and tingling in his thumb and first finger.  Did about 7 to 10 days ago.  He also notices some pain in the wrist the most like a electric sensation when he flexes it forward to reach into his pocket to get his wallet.  Not tried any ice or anti-inflammatory.  Past Medical History:  Diagnosis Date  . Chest pain, atypical   . Femoral hernia   . GERD (gastroesophageal reflux disease)   . RBBB (right bundle branch block with left anterior fascicular block)    hemblock    Past Surgical History:  Procedure Laterality Date  . VARICOCELE EXCISION  2021    Family History  Problem Relation Age of Onset  . Coronary artery disease Other        No family history of premature CAD  . Diabetes Other     Social History   Socioeconomic History  . Marital status: Married    Spouse name: Not on file  . Number of children: Not on file  . Years of education: Not on file  . Highest education level: Not on file  Occupational History  . Occupation: Realtor  Tobacco Use  .  Smoking status: Never Smoker  . Smokeless tobacco: Never Used  Vaping Use  . Vaping Use: Never used  Substance and Sexual Activity  . Alcohol use: No    Comment: occasiona  . Drug use: No  . Sexual activity: Not on file  Other Topics Concern  . Not on file  Social History Narrative  . Not on file   Social Determinants of Health   Financial Resource Strain:   . Difficulty of Paying Living Expenses: Not on file  Food Insecurity:   . Worried About Programme researcher, broadcasting/film/video in the Last Year: Not on file  . Ran Out of Food in the Last Year: Not on file  Transportation Needs:   . Lack of Transportation (Medical): Not on file  . Lack of Transportation (Non-Medical): Not on file  Physical Activity:   . Days of Exercise per Week: Not on file  . Minutes of Exercise per Session: Not on file  Stress:   . Feeling of Stress : Not on file  Social Connections:   . Frequency of Communication with Friends and Family: Not on file  . Frequency of Social Gatherings with Friends and Family: Not on file  .  Attends Religious Services: Not on file  . Active Member of Clubs or Organizations: Not on file  . Attends Banker Meetings: Not on file  . Marital Status: Not on file  Intimate Partner Violence:   . Fear of Current or Ex-Partner: Not on file  . Emotionally Abused: Not on file  . Physically Abused: Not on file  . Sexually Abused: Not on file    Outpatient Medications Prior to Visit  Medication Sig Dispense Refill  . omeprazole (PRILOSEC) 20 MG capsule Take 1 capsule (20 mg total) by mouth daily for 21 days. 21 capsule 0   No facility-administered medications prior to visit.    Allergies  Allergen Reactions  . Citric Acid     ROS Review of Systems    Objective:    Physical Exam Constitutional:      Appearance: He is well-developed.  HENT:     Head: Normocephalic and atraumatic.     Right Ear: External ear normal.     Left Ear: External ear normal.     Nose: Nose  normal.  Eyes:     Conjunctiva/sclera: Conjunctivae normal.     Pupils: Pupils are equal, round, and reactive to light.  Neck:     Thyroid: No thyromegaly.  Pulmonary:     Effort: Pulmonary effort is normal.  Abdominal:     Palpations: Abdomen is soft.  Musculoskeletal:     Comments: Wrist with normal range of motion.  Radial pulse 2+.  Good warm fingertips.  Positive Tennille sign.  Negative Phalen's.  Skin:    General: Skin is warm and dry.  Neurological:     Mental Status: He is alert and oriented to person, place, and time.     Deep Tendon Reflexes: Reflexes are normal and symmetric.  Psychiatric:        Behavior: Behavior normal.        Thought Content: Thought content normal.        Judgment: Judgment normal.     BP 117/64   Pulse 81   Ht 5\' 11"  (1.803 m)   Wt 204 lb (92.5 kg)   SpO2 100%   BMI 28.45 kg/m  Wt Readings from Last 3 Encounters:  10/20/20 204 lb (92.5 kg)  09/21/20 199 lb 3.2 oz (90.4 kg)  04/28/20 198 lb (89.8 kg)     There are no preventive care reminders to display for this patient.  There are no preventive care reminders to display for this patient.  Lab Results  Component Value Date   TSH 1.91 04/09/2019   Lab Results  Component Value Date   WBC 4.4 08/14/2019   HGB 14.0 08/14/2019   HCT 42.4 08/14/2019   MCV 89.3 08/14/2019   PLT 146 08/14/2019   Lab Results  Component Value Date   NA 136 09/28/2020   K 4.1 09/28/2020   CO2 26 09/28/2020   GLUCOSE 102 (H) 09/28/2020   BUN 17 09/28/2020   CREATININE 1.11 09/28/2020   BILITOT 0.3 08/14/2019   ALKPHOS 45 07/31/2016   AST 30 08/14/2019   ALT 23 08/14/2019   PROT 7.7 08/14/2019   ALBUMIN 4.2 07/31/2016   CALCIUM 9.4 09/28/2020   ANIONGAP 10 09/28/2020   Lab Results  Component Value Date   CHOL 183 04/09/2019   Lab Results  Component Value Date   HDL 53 04/09/2019   Lab Results  Component Value Date   LDLCALC 110 (H) 04/09/2019   Lab Results  Component Value  Date    TRIG 101 04/09/2019   Lab Results  Component Value Date   CHOLHDL 3.5 04/09/2019   Lab Results  Component Value Date   HGBA1C 5.5 10/29/2019      Assessment & Plan:   Problem List Items Addressed This Visit    None    Visit Diagnoses    Carpal tunnel syndrome on right    -  Primary   Neck pain on right side       Right wrist pain          Right carpal tunnel syndrome-recommend wearing splints at night, anti-inflammatory, ice as needed.  If not improving over the next 3 to 4 weeks then please let us know and will refer to Dr. Benjamin Stain for possible injections.  Handout provided with additional information and stretches.  Fitted with cock-up splint today.  It is physical in May but says that he had gone to the lab and they were busy and planned on coming back but forgot to return.  Reprinted lab order today.  Right anterior neck pain-just reassured him that the CT was normal.  Suspect that it could be more of a muscle issue he does workout regularly and does weightlifting.  It still present but not bothersome.  He is certainly welcome to just follow it over time and let me know if it is not improving.  He is excited because his wife is pregnant they have been trying for quite some time this is their first child for each of them and they are having a little girl in the spring.  Meds ordered this encounter  Medications  . meloxicam (MOBIC) 15 MG tablet    Sig: Take 1 tablet (15 mg total) by mouth daily as needed for pain.    Dispense:  30 tablet    Refill:  0    Follow-up: No follow-ups on file.    Nani Gasser, MD

## 2020-10-21 LAB — COMPLETE METABOLIC PANEL WITH GFR
AG Ratio: 1.5 (calc) (ref 1.0–2.5)
ALT: 25 U/L (ref 9–46)
AST: 30 U/L (ref 10–40)
Albumin: 4.4 g/dL (ref 3.6–5.1)
Alkaline phosphatase (APISO): 52 U/L (ref 36–130)
BUN: 15 mg/dL (ref 7–25)
CO2: 29 mmol/L (ref 20–32)
Calcium: 9.4 mg/dL (ref 8.6–10.3)
Chloride: 102 mmol/L (ref 98–110)
Creat: 1.19 mg/dL (ref 0.60–1.35)
GFR, Est African American: 84 mL/min/{1.73_m2} (ref >=60)
GFR, Est Non African American: 72 mL/min/{1.73_m2} (ref >=60)
Globulin: 3 g/dL (calc) (ref 1.9–3.7)
Glucose, Bld: 84 mg/dL (ref 65–139)
Potassium: 4.2 mmol/L (ref 3.5–5.3)
Sodium: 139 mmol/L (ref 135–146)
Total Bilirubin: 0.4 mg/dL (ref 0.2–1.2)
Total Protein: 7.4 g/dL (ref 6.1–8.1)

## 2020-10-21 LAB — CBC
HCT: 39.6 % (ref 38.5–50.0)
Hemoglobin: 13.6 g/dL (ref 13.2–17.1)
MCH: 30.3 pg (ref 27.0–33.0)
MCHC: 34.3 g/dL (ref 32.0–36.0)
MCV: 88.2 fL (ref 80.0–100.0)
MPV: 11.5 fL (ref 7.5–12.5)
Platelets: 190 10*3/uL (ref 140–400)
RBC: 4.49 10*6/uL (ref 4.20–5.80)
RDW: 13 % (ref 11.0–15.0)
WBC: 3.7 10*3/uL — ABNORMAL LOW (ref 3.8–10.8)

## 2020-10-21 LAB — PSA: PSA: 0.71 ng/mL (ref <=4.0)

## 2020-10-21 LAB — LIPID PANEL
Cholesterol: 218 mg/dL — ABNORMAL HIGH (ref <200)
HDL: 50 mg/dL (ref >=40)
LDL Cholesterol (Calc): 133 mg/dL (calc) — ABNORMAL HIGH
Non-HDL Cholesterol (Calc): 168 mg/dL (calc) — ABNORMAL HIGH (ref <130)
Total CHOL/HDL Ratio: 4.4 (calc) (ref <5.0)
Triglycerides: 209 mg/dL — ABNORMAL HIGH (ref <150)

## 2020-12-13 DIAGNOSIS — Z1152 Encounter for screening for COVID-19: Secondary | ICD-10-CM | POA: Diagnosis not present

## 2021-02-18 ENCOUNTER — Ambulatory Visit (INDEPENDENT_AMBULATORY_CARE_PROVIDER_SITE_OTHER): Payer: BC Managed Care – PPO | Admitting: Family Medicine

## 2021-02-18 ENCOUNTER — Other Ambulatory Visit: Payer: Self-pay

## 2021-02-18 ENCOUNTER — Encounter: Payer: Self-pay | Admitting: Family Medicine

## 2021-02-18 VITALS — BP 119/57 | HR 85 | Ht 71.0 in | Wt 199.0 lb

## 2021-02-18 DIAGNOSIS — G44209 Tension-type headache, unspecified, not intractable: Secondary | ICD-10-CM | POA: Diagnosis not present

## 2021-02-18 DIAGNOSIS — R7301 Impaired fasting glucose: Secondary | ICD-10-CM | POA: Diagnosis not present

## 2021-02-18 DIAGNOSIS — R519 Headache, unspecified: Secondary | ICD-10-CM

## 2021-02-18 LAB — POCT GLYCOSYLATED HEMOGLOBIN (HGB A1C): Hemoglobin A1C: 6.7 % — AB (ref 4.0–5.6)

## 2021-02-18 MED ORDER — AMITRIPTYLINE HCL 25 MG PO TABS: 25.0000 mg | ORAL_TABLET | Freq: Every day | ORAL | 2 refills | Status: DC

## 2021-02-18 MED ORDER — PREDNISONE 10 MG PO TABS: ORAL_TABLET | ORAL | 0 refills | Status: DC

## 2021-02-18 NOTE — Progress Notes (Signed)
Established Patient Office Visit  Subjective:  Patient ID: Brett Hall, male    DOB: Jul 18, 1971  Age: 50 y.o. MRN: [id number]  CC:  Chief Complaint  Patient presents with  . Headache    HPI Elman Dettman presents for frequent headaches.  Last seen in HA from 2019. Last HA started about 3 days ago.  Using Tylenol - helps some.  Getting a dull HA with no throbbing. No nausea.  No recent changes to diet or supplements. No caffine.  Today on the left side of his head radiating from posterior to forehead are.  See prior notes.  His daughter is due any day now in the last 2 weeks.    Also has a family hix of diabetes.  Hx of IFG back in 2019.    Past Medical History:  Diagnosis Date  . Chest pain, atypical   . Femoral hernia   . GERD (gastroesophageal reflux disease)   . RBBB (right bundle branch block with left anterior fascicular block)    hemblock    Past Surgical History:  Procedure Laterality Date  . VARICOCELE EXCISION  2021    Family History  Problem Relation Age of Onset  . Coronary artery disease Other        No family history of premature CAD  . Diabetes Other     Social History   Socioeconomic History  . Marital status: Married    Spouse name: Not on file  . Number of children: Not on file  . Years of education: Not on file  . Highest education level: Not on file  Occupational History  . Occupation: Realtor  Tobacco Use  . Smoking status: Never Smoker  . Smokeless tobacco: Never Used  Vaping Use  . Vaping Use: Never used  Substance and Sexual Activity  . Alcohol use: No    Comment: occasiona  . Drug use: No  . Sexual activity: Not on file  Other Topics Concern  . Not on file  Social History Narrative  . Not on file   Social Determinants of Health   Financial Resource Strain: Not on file  Food Insecurity: Not on file  Transportation Needs: Not on file  Physical Activity: Not on file  Stress: Not on file  Social Connections: Not on file   Intimate Partner Violence: Not on file    Outpatient Medications Prior to Visit  Medication Sig Dispense Refill  . meloxicam (MOBIC) 15 MG tablet Take 1 tablet (15 mg total) by mouth daily as needed for pain. 30 tablet 0   No facility-administered medications prior to visit.    Allergies  Allergen Reactions  . Citric Acid     ROS Review of Systems    Objective:    Physical Exam Constitutional:      Appearance: He is well-developed.  HENT:     Head: Normocephalic and atraumatic.     Right Ear: Tympanic membrane, ear canal and external ear normal.     Left Ear: Tympanic membrane, ear canal and external ear normal.     Nose: Nose normal.     Mouth/Throat:     Mouth: Mucous membranes are moist.     Pharynx: Oropharynx is clear. No posterior oropharyngeal erythema.  Eyes:     Conjunctiva/sclera: Conjunctivae normal.     Pupils: Pupils are equal, round, and reactive to light.  Neck:     Thyroid: No thyromegaly.  Cardiovascular:     Rate and Rhythm: Normal rate and regular rhythm.  Heart sounds: Normal heart sounds.  Pulmonary:     Effort: Pulmonary effort is normal.     Breath sounds: Normal breath sounds.  Musculoskeletal:     Cervical back: Neck supple.  Lymphadenopathy:     Cervical: No cervical adenopathy.  Skin:    General: Skin is warm and dry.  Neurological:     Mental Status: He is alert and oriented to person, place, and time.  Psychiatric:        Mood and Affect: Mood normal.        Behavior: Behavior normal.     BP (!) 119/57   Pulse 85   Ht 5\' 11"  (1.803 m)   Wt 199 lb (90.3 kg)   SpO2 100%   BMI 27.75 kg/m  Wt Readings from Last 3 Encounters:  02/18/21 199 lb (90.3 kg)  10/20/20 204 lb (92.5 kg)  09/21/20 199 lb 3.2 oz (90.4 kg)     Health Maintenance Due  Topic Date Due  . HIV Screening  Never done  . COLONOSCOPY (Pts 45-50yrs Insurance coverage will need to be confirmed)  Never done    There are no preventive care reminders to  display for this patient.  Lab Results  Component Value Date   TSH 1.91 04/09/2019   Lab Results  Component Value Date   WBC 3.7 (L) 10/20/2020   HGB 13.6 10/20/2020   HCT 39.6 10/20/2020   MCV 88.2 10/20/2020   PLT 190 10/20/2020   Lab Results  Component Value Date   NA 139 10/20/2020   K 4.2 10/20/2020   CO2 29 10/20/2020   GLUCOSE 84 10/20/2020   BUN 15 10/20/2020   CREATININE 1.19 10/20/2020   BILITOT 0.4 10/20/2020   ALKPHOS 45 07/31/2016   AST 30 10/20/2020   ALT 25 10/20/2020   PROT 7.4 10/20/2020   ALBUMIN 4.2 07/31/2016   CALCIUM 9.4 10/20/2020   ANIONGAP 10 09/28/2020   Lab Results  Component Value Date   CHOL 218 (H) 10/20/2020   Lab Results  Component Value Date   HDL 50 10/20/2020   Lab Results  Component Value Date   LDLCALC 133 (H) 10/20/2020   Lab Results  Component Value Date   TRIG 209 (H) 10/20/2020   Lab Results  Component Value Date   CHOLHDL 4.4 10/20/2020   Lab Results  Component Value Date   HGBA1C 6.7 (A) 02/18/2021      Assessment & Plan:   Problem List Items Addressed This Visit      Endocrine   IFG (impaired fasting glucose) - Primary    Discussed dx.  We had not check A1C recently.  He has a family history.  A1C today of 6.7 today.  Discussed diet changes.  He does great job with exercise.  Recheck in 90 days.  Will hold on  Medication at this point.        Relevant Orders   POCT glycosylated hemoglobin (Hb A1C) (Completed)     Other   Chronic daily headache    MRI normal. Discussed tx options.  Will treat acutely with prednisone.  Will start amitriptyline and f/U in 3 months.        Relevant Medications   predniSONE (DELTASONE) 10 MG tablet   amitriptyline (ELAVIL) 25 MG tablet    Other Visit Diagnoses    Tension headache       Relevant Medications   amitriptyline (ELAVIL) 25 MG tablet     Tension  HA -  discussed treatment options.   Meds ordered this encounter  Medications  . predniSONE (DELTASONE)  10 MG tablet    Sig: 8 tabs PO Day 1, 6 tabs Day 2, 4 tab Day 3, 2 tab Day 4, 1 tab Day 4    Dispense:  21 tablet    Refill:  0  . amitriptyline (ELAVIL) 25 MG tablet    Sig: Take 1 tablet (25 mg total) by mouth at bedtime.    Dispense:  30 tablet    Refill:  2    Follow-up: No follow-ups on file.    Nani Gasser, MD

## 2021-02-18 NOTE — Assessment & Plan Note (Signed)
Discussed dx.  We had not check A1C recently.  He has a family history.  A1C today of 6.7 today.  Discussed diet changes.  He does great job with exercise.  Recheck in 90 days.  Will hold on  Medication at this point.

## 2021-02-18 NOTE — Assessment & Plan Note (Signed)
MRI normal. Discussed tx options.  Will treat acutely with prednisone.  Will start amitriptyline and f/U in 3 months.

## 2021-02-18 NOTE — Progress Notes (Signed)
Pt reports that he has been experiencing headaches for the past month. The headaches are getting worse for the past . He describes the pain as dull. It starts at the base of his head and radiates forward,denies any visual changes but he said that it causes him to feel drowsy, and that it feels like something is "draining". He takes tylenol for the pain.

## 2021-04-20 ENCOUNTER — Encounter: Payer: BC Managed Care – PPO | Admitting: Family Medicine

## 2021-05-03 ENCOUNTER — Ambulatory Visit (INDEPENDENT_AMBULATORY_CARE_PROVIDER_SITE_OTHER): Payer: BC Managed Care – PPO

## 2021-05-03 ENCOUNTER — Ambulatory Visit: Payer: BC Managed Care – PPO | Admitting: Physician Assistant

## 2021-05-03 ENCOUNTER — Other Ambulatory Visit: Payer: Self-pay

## 2021-05-03 VITALS — BP 111/64 | HR 90 | Ht 71.0 in | Wt 200.0 lb

## 2021-05-03 DIAGNOSIS — K5900 Constipation, unspecified: Secondary | ICD-10-CM

## 2021-05-03 DIAGNOSIS — R109 Unspecified abdominal pain: Secondary | ICD-10-CM | POA: Diagnosis not present

## 2021-05-03 DIAGNOSIS — R1032 Left lower quadrant pain: Secondary | ICD-10-CM

## 2021-05-03 DIAGNOSIS — R3129 Other microscopic hematuria: Secondary | ICD-10-CM | POA: Diagnosis not present

## 2021-05-03 DIAGNOSIS — R10A2 Flank pain, left side: Secondary | ICD-10-CM

## 2021-05-03 LAB — POCT URINALYSIS DIP (CLINITEK)
Bilirubin, UA: NEGATIVE
Glucose, UA: NEGATIVE mg/dL
Ketones, POC UA: NEGATIVE mg/dL
Leukocytes, UA: NEGATIVE
Nitrite, UA: NEGATIVE
POC PROTEIN,UA: NEGATIVE
Spec Grav, UA: 1.025 (ref 1.010–1.025)
Urobilinogen, UA: 0.2 E.U./dL
pH, UA: 6 (ref 5.0–8.0)

## 2021-05-03 MED ORDER — TAMSULOSIN HCL 0.4 MG PO CAPS: 0.4000 mg | ORAL_CAPSULE | Freq: Every day | ORAL | 0 refills | Status: DC

## 2021-05-03 NOTE — Patient Instructions (Signed)
Textbook of Natural Medicine (5th ed., pp. 1518-1527.e3). [place], [place]: Elsevier.">  Dietary Guidelines to Help Prevent Kidney Stones Kidney stones are deposits of minerals and salts that form inside your kidneys. Your risk of developing kidney stones may be greater depending on your diet, your lifestyle, the medicines you take, and whether you have certain medical conditions. Most people can lower their chances of developing kidney stones by following the instructions below. Your dietitian may give you more specific instructions depending on your overall health and the type of kidney stones you tend to develop. What are tips for following this plan? Reading food labels  Choose foods with "no salt added" or "low-salt" labels. Limit your salt (sodium) intake to less than 1,500 mg a day.  Choose foods with calcium for each meal and snack. Try to eat about 300 mg of calcium at each meal. Foods that contain 200-500 mg of calcium a serving include: ? 8 oz (237 mL) of milk, calcium-fortifiednon-dairy milk, and calcium-fortifiedfruit juice. Calcium-fortified means that calcium has been added to these drinks. ? 8 oz (237 mL) of kefir, yogurt, and soy yogurt. ? 4 oz (114 g) of tofu. ? 1 oz (28 g) of cheese. ? 1 cup (150 g) of dried figs. ? 1 cup (91 g) of cooked broccoli. ? One 3 oz (85 g) can of sardines or mackerel. Most people need 1,000-1,500 mg of calcium a day. Talk to your dietitian about how much calcium is recommended for you.   Shopping  Buy plenty of fresh fruits and vegetables. Most people do not need to avoid fruits and vegetables, even if these foods contain nutrients that may contribute to kidney stones.  When shopping for convenience foods, choose: ? Whole pieces of fruit. ? Pre-made salads with dressing on the side. ? Low-fat fruit and yogurt smoothies.  Avoid buying frozen meals or prepared deli foods. These can be high in sodium.  Look for foods with live cultures, such as  yogurt and kefir.  Choose high-fiber grains, such as whole-wheat breads, oat bran, and wheat cereals. Cooking  Do not add salt to food when cooking. Place a salt shaker on the table and allow each person to add his or her own salt to taste.  Use vegetable protein, such as beans, textured vegetable protein (TVP), or tofu, instead of meat in pasta, casseroles, and soups. Meal planning  Eat less salt, if told by your dietitian. To do this: ? Avoid eating processed or pre-made food. ? Avoid eating fast food.  Eat less animal protein, including cheese, meat, poultry, or fish, if told by your dietitian. To do this: ? Limit the number of times you have meat, poultry, fish, or cheese each week. Eat a diet free of meat at least 2 days a week. ? Eat only one serving each day of meat, poultry, fish, or seafood. ? When you prepare animal protein, cut pieces into small portion sizes. For most meat and fish, one serving is about the size of the palm of your hand.  Eat at least five servings of fresh fruits and vegetables each day. To do this: ? Keep fruits and vegetables on hand for snacks. ? Eat one piece of fruit or a handful of berries with breakfast. ? Have a salad and fruit at lunch. ? Have two kinds of vegetables at dinner.  Limit foods that are high in a substance called oxalate. These include: ? Spinach (cooked), rhubarb, beets, sweet potatoes, and Swiss chard. ? Peanuts. ?   Potato chips, french fries, and baked potatoes with skin on. ? Nuts and nut products. ? Chocolate.  If you regularly take a diuretic medicine, make sure to eat at least 1 or 2 servings of fruits or vegetables that are high in potassium each day. These include: ? Avocado. ? Banana. ? Orange, prune, carrot, or tomato juice. ? Baked potato. ? Cabbage. ? Beans and split peas. Lifestyle  Drink enough fluid to keep your urine pale yellow. This is the most important thing you can do. Spread your fluid intake throughout  the day.  If you drink alcohol: ? Limit how much you use to:  0-1 drink a day for women who are not pregnant.  0-2 drinks a day for men. ? Be aware of how much alcohol is in your drink. In the [place] one drink equals one 12 oz bottle of beer (355 mL), one 5 oz glass of wine (148 mL), or one 1 oz glass of hard liquor (44 mL).  Lose weight if told by your health care provider. Work with your dietitian to find an eating plan and weight loss strategies that work best for you.   General information  Talk to your health care provider and dietitian about taking daily supplements. You may be told the following depending on your health and the cause of your kidney stones: ? Not to take supplements with vitamin C. ? To take a calcium supplement. ? To take a daily probiotic supplement. ? To take other supplements such as magnesium, fish oil, or vitamin B6.  Take over-the-counter and prescription medicines only as told by your health care provider. These include supplements. What foods should I limit? Limit your intake of the following foods, or eat them as told by your dietitian. Vegetables Spinach. Rhubarb. Beets. Canned vegetables. Angie Fava. Olives. Baked potatoes with skin. Grains Wheat bran. Baked goods. Salted crackers. Cereals high in sugar. Meats and other proteins Nuts. Nut butters. Large portions of meat, poultry, or fish. Salted, precooked, or cured meats, such as sausages, meat loaves, and hot dogs. Dairy Cheese. Beverages Regular soft drinks. Regular vegetable juice. Seasonings and condiments Seasoning blends with salt. Salad dressings. Soy sauce. Ketchup. Barbecue sauce. Other foods Canned soups. Canned pasta sauce. Casseroles. Pizza. Lasagna. Frozen meals. Potato chips. [place] fries. The items listed above may not be a complete list of foods and beverages you should limit. Contact a dietitian for more information. What foods should I avoid? Talk to your dietitian about  specific foods you should avoid based on the type of kidney stones you have and your overall health. Fruits Grapefruit. The item listed above may not be a complete list of foods and beverages you should avoid. Contact a dietitian for more information. Summary  Kidney stones are deposits of minerals and salts that form inside your kidneys.  You can lower your risk of kidney stones by making changes to your diet.  The most important thing you can do is drink enough fluid. Drink enough fluid to keep your urine pale yellow.  Talk to your dietitian about how much calcium you should have each day, and eat less salt and animal protein as told by your dietitian. This information is not intended to replace advice given to you by your health care provider. Make sure you discuss any questions you have with your health care provider. Document Revised: 11/13/2019 Document Reviewed: 11/13/2019 Elsevier Patient Education  2021 [place]. Kidney Stones Kidney stones are rock-like masses that form inside of the  kidneys. Kidneys are organs that make pee (urine). A kidney stone may move into other parts of the urinary tract, including:  The tubes that connect the kidneys to the bladder (ureters).  The bladder.  The tube that carries urine out of the body (urethra). Kidney stones can cause very bad pain and can block the flow of pee. The stone usually leaves your body (passes) through your pee. You may need to have a doctor take out the stone. What are the causes? Kidney stones may be caused by:  A condition in which certain glands make too much parathyroid hormone (primary hyperparathyroidism).  A buildup of a type of crystals in the bladder made of a chemical called uric acid. The body makes uric acid when you eat certain foods.  Narrowing (stricture) of one or both of the ureters.  A kidney blockage that you were born with.  Past surgery on the kidney or the ureters, such as gastric bypass  surgery. What increases the risk? You are more likely to develop this condition if:  You have had a kidney stone in the past.  You have a family history of kidney stones.  You do not drink enough water.  You eat a diet that is high in protein, salt (sodium), or sugar.  You are overweight or very overweight (obese). What are the signs or symptoms? Symptoms of a kidney stone may include:  Pain in the side of the belly, right below the ribs (flank pain). Pain usually spreads (radiates) to the groin.  Needing to pee often or right away (urgently).  Pain when going pee (urinating).  Blood in your pee (hematuria).  Feeling like you may vomit (nauseous).  Vomiting.  Fever and chills. How is this treated? Treatment depends on the size, location, and makeup of the kidney stones. The stones will often pass out of the body through peeing. You may need to:  Drink more fluid to help pass the stone. In some cases, you may be given fluids through an IV tube put into one of your veins at the hospital.  Take medicine for pain.  Make changes in your diet to help keep kidney stones from coming back. Sometimes, medical procedures are needed to remove a kidney stone. This may involve:  A procedure to break up kidney stones using a beam of light (laser) or shock waves.  Surgery to remove the kidney stones. Follow these instructions at home: Medicines  Take over-the-counter and prescription medicines only as told by your doctor.  Ask your doctor if the medicine prescribed to you requires you to avoid driving or using heavy machinery. Eating and drinking  Drink enough fluid to keep your pee pale yellow. You may be told to drink at least 8-10 glasses of water each day. This will help you pass the stone.  If told by your doctor, change your diet. This may include: ? Limiting how much salt you eat. ? Eating more fruits and vegetables. ? Limiting how much meat, poultry, fish, and eggs you  eat.  Follow instructions from your doctor about eating or drinking restrictions. General instructions  Collect pee samples as told by your doctor. You may need to collect a pee sample: ? 24 hours after a stone comes out. ? 8-12 weeks after a stone comes out, and every 6-12 months after that.  Strain your pee every time you pee (urinate), for as long as told. Use the strainer that your doctor recommends.  Do not throw out the   stone. Keep it so that it can be tested by your doctor.  Keep all follow-up visits as told by your doctor. This is important. You may need follow-up tests. How is this prevented? To prevent another kidney stone:  Drink enough fluid to keep your pee pale yellow. This is the best way to prevent kidney stones.  Eat healthy foods.  Avoid certain foods as told by your doctor. You may be told to eat less protein.  Stay at a healthy weight.   Where to find more information  National Kidney Foundation (NKF): www.kidney.org  [facility] (U[facility]): www.urologyhealth.org Contact a doctor if:  You have pain that gets worse or does not get better with medicine. Get help right away if:  You have a fever or chills.  You get very bad pain.  You get new pain in your belly (abdomen).  You pass out (faint).  You cannot pee. Summary  Kidney stones are rock-like masses that form inside of the kidneys.  Kidney stones can cause very bad pain and can block the flow of pee.  The stones will often pass out of the body through peeing.  Drink enough fluid to keep your pee pale yellow. This information is not intended to replace advice given to you by your health care provider. Make sure you discuss any questions you have with your health care provider. Document Revised: 04/08/2019 Document Reviewed: 04/08/2019 Elsevier Patient Education  2021 Elsevier Inc.  

## 2021-05-03 NOTE — Progress Notes (Signed)
Duard,   No renal stones seen. You do have moderate amount of stool in colon. You need to take 1 capful for miralax twice a day with 4oz of juice for the next 7 days. No obstruction.

## 2021-05-03 NOTE — Progress Notes (Signed)
Subjective:    Patient ID: Brett Hall, male    DOB: 05-17-1971, 50 y.o.   MRN: [id number]  HPI  Pt is a 50 yo male who presents to the clinic with left lower abdominal and flank pain that started Saturday 04/23/21. It was very painful and sharp at first and now just crampy. No increase in urination, painful urination or change in frequency. No changes in stool. Denies any melena or hematochezia. No changes in diet or medication. No penile discharge. No history of injury or strain. Never had anything like this before. Not tried anything to make better. Nothing seems to make worse. It is getting better on its own. No fever, chills, n/v/d.   .. Active Ambulatory Problems    Diagnosis Date Noted  . BUNDLE BRANCH BLOCK, RIGHT HEMIBLOCK 02/06/2011  . Gastroesophageal reflux disease 02/22/2011  . Low back pain 11/22/2012  . Enlarged prostate 04/10/2014  . ED (erectile dysfunction) 08/05/2015  . IFG (impaired fasting glucose) 05/14/2018  . Elevated CK 08/15/2019  . Solitary lung nodule 08/15/2019  . Pelvic pain in male 10/29/2019  . Varicocele 04/05/2020  . Chronic daily headache 04/20/2020  . Left lower quadrant abdominal pain 05/04/2021  . Left flank pain 05/04/2021  . Other microscopic hematuria 05/04/2021  . Constipation 05/04/2021   Resolved Ambulatory Problems    Diagnosis Date Noted  . CHEST PAIN, ATYPICAL 02/06/2011  . Family planning 11/22/2012  . Abdominal pain 08/05/2015   Past Medical History:  Diagnosis Date  . Chest pain, atypical   . Femoral hernia   . GERD (gastroesophageal reflux disease)   . RBBB (right bundle branch block with left anterior fascicular block)        Review of Systems See HPI.     Objective:   Physical Exam Vitals reviewed.  Constitutional:      Appearance: Normal appearance.  HENT:     Head: Normocephalic.  Cardiovascular:     Rate and Rhythm: Normal rate and regular rhythm.     Pulses: Normal pulses.  Abdominal:     General:  Bowel sounds are normal. There is no distension.     Palpations: Abdomen is soft. There is no mass.     Tenderness: There is abdominal tenderness. There is no right CVA tenderness, left CVA tenderness, guarding or rebound.     Comments: Diffuse tenderness in the left lower quadrant. No mass/hernia.   Neurological:     General: No focal deficit present.     Mental Status: He is alert and oriented to person, place, and time.  Psychiatric:        Mood and Affect: Mood normal.     .. Results for orders placed or performed in visit on 05/03/21  Urine Microscopic  Result Value Ref Range   WBC, UA NONE SEEN 0 - 5 /HPF   RBC / HPF NONE SEEN 0 - 2 /HPF   Squamous Epithelial / LPF NONE SEEN < OR = 5 /HPF   Bacteria, UA NONE SEEN NONE SEEN /HPF   Hyaline Cast NONE SEEN NONE SEEN /LPF   Note    POCT URINALYSIS DIP (CLINITEK)  Result Value Ref Range   Color, UA yellow yellow   Clarity, UA clear clear   Glucose, UA negative negative mg/dL   Bilirubin, UA negative negative   Ketones, POC UA negative negative mg/dL   Spec Grav, UA 3.557 3.220 - 1.025   Blood, UA trace-lysed (A) negative   pH, UA 6.0 5.0 -  8.0   POC PROTEIN,UA negative negative, trace   Urobilinogen, UA 0.2 0.2 or 1.0 E.U./dL   Nitrite, UA Negative Negative   Leukocytes, UA Negative Negative         Assessment & Plan:  Marland KitchenMarland KitchenLaderrick was seen today for pain.  Diagnoses and all orders for this visit:  Left flank pain -     DG Abd 1 View; Future -     tamsulosin (FLOMAX) 0.4 MG CAPS capsule; Take 1 capsule (0.4 mg total) by mouth daily after supper. -     POCT URINALYSIS DIP (CLINITEK) -     Urine Microscopic  Left lower quadrant abdominal pain -     DG Abd 1 View; Future -     tamsulosin (FLOMAX) 0.4 MG CAPS capsule; Take 1 capsule (0.4 mg total) by mouth daily after supper.  Other microscopic hematuria  Constipation, unspecified constipation type   Unclear etiology possible kidney stone vs constipation.  UA  showed trace blood. Will get microscopic. Not having urinary complaints. No sign of infection.  Could be clearing kidney stone. flomax added for 15 days. Increase hydration. Abd xray showed moderate stool burden. I suspect more constipation.  Start miralax twice day for 7 days then as needed.  Certainly could represent some musculoskelatal strain as well. biofreeze and massage could help.  Follow up with any new or worsening symptoms or in 2 weeks to make sure blood in urine has resolved.

## 2021-05-03 NOTE — Progress Notes (Deleted)
Started last Saturday (04/23/21)  Left lower quadrant pain - sharp at first, now not as bad (just achy) No issues with bowel, urine, no change in diet Discomfort Nothing makes worse.  No blood Bowel movement soft loose. No mucus No pain with urination.  Worse-

## 2021-05-04 ENCOUNTER — Encounter: Payer: Self-pay | Admitting: Physician Assistant

## 2021-05-04 DIAGNOSIS — K5900 Constipation, unspecified: Secondary | ICD-10-CM | POA: Insufficient documentation

## 2021-05-04 DIAGNOSIS — R3129 Other microscopic hematuria: Secondary | ICD-10-CM | POA: Insufficient documentation

## 2021-05-04 DIAGNOSIS — R1032 Left lower quadrant pain: Secondary | ICD-10-CM | POA: Insufficient documentation

## 2021-05-04 DIAGNOSIS — R109 Unspecified abdominal pain: Secondary | ICD-10-CM | POA: Insufficient documentation

## 2021-05-04 LAB — URINALYSIS, MICROSCOPIC ONLY
Bacteria, UA: NONE SEEN /HPF
Hyaline Cast: NONE SEEN /LPF
RBC / HPF: NONE SEEN /HPF (ref 0–2)
Squamous Epithelial / HPF: NONE SEEN /HPF (ref <=5)
WBC, UA: NONE SEEN /HPF (ref 0–5)

## 2021-05-04 NOTE — Progress Notes (Signed)
Urine microscopic looked perfectly normal.

## 2021-05-10 ENCOUNTER — Encounter: Payer: BC Managed Care – PPO | Admitting: Family Medicine

## 2021-06-09 ENCOUNTER — Encounter: Payer: BC Managed Care – PPO | Admitting: Family Medicine

## 2021-07-04 ENCOUNTER — Other Ambulatory Visit: Payer: Self-pay

## 2021-07-04 ENCOUNTER — Ambulatory Visit: Payer: BC Managed Care – PPO | Admitting: Medical-Surgical

## 2021-07-04 ENCOUNTER — Encounter: Payer: Self-pay | Admitting: Medical-Surgical

## 2021-07-04 VITALS — BP 102/70 | HR 86 | Resp 20 | Ht 71.0 in | Wt 199.1 lb

## 2021-07-04 DIAGNOSIS — R1032 Left lower quadrant pain: Secondary | ICD-10-CM

## 2021-07-04 DIAGNOSIS — Z1211 Encounter for screening for malignant neoplasm of colon: Secondary | ICD-10-CM | POA: Diagnosis not present

## 2021-07-04 LAB — POCT URINALYSIS DIPSTICK
Appearance: NORMAL
Bilirubin, UA: NEGATIVE
Glucose, UA: NEGATIVE
Ketones, UA: NEGATIVE
Nitrite, UA: NEGATIVE
Odor: NORMAL
Protein, UA: NEGATIVE
Spec Grav, UA: 1.025 (ref 1.010–1.025)
Urobilinogen, UA: 0.2 E.U./dL
pH, UA: 5.5 (ref 5.0–8.0)

## 2021-07-04 NOTE — Progress Notes (Signed)
  HPI with pertinent ROS:   CC: LLQ abdominal pain  HPI: Pleasant 50 year old male presenting today for evaluation of left lower quadrant abdominal pain.  He was seen by Lesly Rubenstein approximately 6 weeks ago with similar complaints.  An abdominal x-ray was completed with findings of moderate stool burden in the colon and rectum.  At that time he was instructed to take MiraLAX twice daily x7 days.  He was also given 2 weeks of Flomax in case of a kidney stone.  He notes that he did do a couple of days of MiraLAX but stopped it shortly after.  He has 1-2 bowel movements daily.  Approximately 50% of them are hard to pass requiring straining.  His stool is light brown with no signs of melena or hematochezia.  He does note that the pain is intermittent and not excruciating.  He describes it as more of a light throbbing that is just enough to be annoying.  No current urinary symptoms.  No fevers, chills, chest pain, shortness of breath, or unusual fatigue.  I reviewed the past medical history, family history, social history, surgical history, and allergies today and no changes were needed.  Please see the problem list section below in epic for further details.  Physical exam:   General: Well Developed, well nourished, and in no acute distress.  Neuro: Alert and oriented x3.  HEENT: Normocephalic, atraumatic.  Skin: Warm and dry. Cardiac: Regular rate and rhythm, no murmurs rubs or gallops, no lower extremity edema.  Respiratory: Clear to auscultation bilaterally. Not using accessory muscles, speaking in full sentences. Abdomen: Soft, nontender, nondistended. Bowel sounds + x 4 quadrants. No HSM appreciated.  Impression and Recommendations:    1. Left lower quadrant abdominal pain POCT UA positive for small leukocytes and trace lysed blood.  Sending for culture.  Since he did not complete the MiraLAX as directed for the moderate stool burden, feel this is contributing to his left lower quadrant abdominal  pain.  Recommend restarting MiraLAX twice daily for 7 days as instructed.  Monitor for possible muscle strain due to intense physical exercise. - POCT Urinalysis Dipstick - Urine Culture  2. Colon cancer screening Referring to GI for colonoscopy per patient request. - Ambulatory referral to Gastroenterology  Return for CPE with PCP as scheduled or sooner if needed. ___________________________________________ Thayer Ohm, DNP, APRN, FNP-BC Primary Care and Sports Medicine [facility]

## 2021-07-06 LAB — URINE CULTURE
MICRO NUMBER:: 12188407
SPECIMEN QUALITY:: ADEQUATE

## 2021-07-26 ENCOUNTER — Ambulatory Visit (INDEPENDENT_AMBULATORY_CARE_PROVIDER_SITE_OTHER): Payer: BC Managed Care – PPO | Admitting: Family Medicine

## 2021-07-26 ENCOUNTER — Other Ambulatory Visit: Payer: Self-pay

## 2021-07-26 ENCOUNTER — Encounter: Payer: Self-pay | Admitting: Family Medicine

## 2021-07-26 VITALS — BP 119/60 | HR 93 | Temp 98.4°F | Ht 71.0 in | Wt 194.0 lb

## 2021-07-26 DIAGNOSIS — Z114 Encounter for screening for human immunodeficiency virus [HIV]: Secondary | ICD-10-CM | POA: Diagnosis not present

## 2021-07-26 DIAGNOSIS — R7301 Impaired fasting glucose: Secondary | ICD-10-CM | POA: Diagnosis not present

## 2021-07-26 DIAGNOSIS — Z1211 Encounter for screening for malignant neoplasm of colon: Secondary | ICD-10-CM

## 2021-07-26 DIAGNOSIS — Z0000 Encounter for general adult medical examination without abnormal findings: Secondary | ICD-10-CM

## 2021-07-26 NOTE — Assessment & Plan Note (Signed)
Due for repeat globin A1c.  He states he is really changed up his diet he is cut back on carbs and sweets.  We will recheck it again today hopefully looks much better

## 2021-07-26 NOTE — Progress Notes (Signed)
CPE  Established Patient Office Visit  Subjective:  Patient ID: Brett Hall, male    DOB: Dec 03, 1971  Age: 50 y.o. MRN: [id number]  CC:  Chief Complaint  Patient presents with   Annual Exam    HPI [name] presents for wellness exam.  He is doing well overall.  He and his wife had a little girl her name is [name] she is very healthy and doing well.  He still gets some occasional intermittent pelvic pain but he has had multiple work-ups for this in the past.  He at least now he knows it is nothing worrisome.  He exercises regularly at the gym.  Last Tdap 2021.  Past Medical History:  Diagnosis Date   Chest pain, atypical    Femoral hernia    GERD (gastroesophageal reflux disease)    RBBB (right bundle branch block with left anterior fascicular block)    hemblock    Past Surgical History:  Procedure Laterality Date   VARICOCELE EXCISION  2021    Family History  Problem Relation Age of Onset   Coronary artery disease Other        No family history of premature CAD   Diabetes Other     Social History   Socioeconomic History   Marital status: Married    Spouse name: Belinda   Number of children: 1   Years of education: Not on file   Highest education level: Not on file  Occupational History   Occupation: Veterinary surgeon  Tobacco Use   Smoking status: Never   Smokeless tobacco: Never  Vaping Use   Vaping Use: Never used  Substance and Sexual Activity   Alcohol use: No    Comment: occasiona   Drug use: No   Sexual activity: Not on file  Other Topics Concern   Not on file  Social History Narrative   Daughter named Orthoptist   Social Determinants of Health   Financial Resource Strain: Not on file  Food Insecurity: Not on file  Transportation Needs: Not on file  Physical Activity: Not on file  Stress: Not on file  Social Connections: Not on file  Intimate Partner Violence: Not on file    Outpatient Medications Prior to Visit  Medication Sig Dispense  Refill   tamsulosin (FLOMAX) 0.4 MG CAPS capsule Take 1 capsule (0.4 mg total) by mouth daily after supper. 15 capsule 0   No facility-administered medications prior to visit.    Allergies  Allergen Reactions   Citric Acid Swelling    Nasal swelling & redness    ROS Review of Systems    Objective:    Physical Exam Constitutional:      Appearance: He is well-developed.  HENT:     Head: Normocephalic and atraumatic.     Right Ear: External ear normal.     Left Ear: External ear normal.     Nose: Nose normal.  Eyes:     Conjunctiva/sclera: Conjunctivae normal.     Pupils: Pupils are equal, round, and reactive to light.  Neck:     Thyroid: No thyromegaly.  Cardiovascular:     Rate and Rhythm: Normal rate and regular rhythm.     Heart sounds: Normal heart sounds.  Pulmonary:     Effort: Pulmonary effort is normal.     Breath sounds: Normal breath sounds.  Abdominal:     General: Bowel sounds are normal. There is no distension.     Palpations: Abdomen is soft. There is no mass.  Tenderness: There is no abdominal tenderness. There is no guarding or rebound.  Musculoskeletal:        General: Normal range of motion.     Cervical back: Normal range of motion and neck supple.  Lymphadenopathy:     Cervical: No cervical adenopathy.  Skin:    General: Skin is warm and dry.  Neurological:     Mental Status: He is alert and oriented to person, place, and time.     Deep Tendon Reflexes: Reflexes are normal and symmetric.  Psychiatric:        Behavior: Behavior normal.        Thought Content: Thought content normal.        Judgment: Judgment normal.    BP 119/60   Pulse 93   Temp 98.4 F (36.9 C)   Ht 5\' 11"  (1.803 m)   Wt 194 lb (88 kg)   SpO2 99%   BMI 27.06 kg/m  Wt Readings from Last 3 Encounters:  07/26/21 194 lb (88 kg)  07/04/21 199 lb 1.6 oz (90.3 kg)  05/03/21 200 lb (90.7 kg)     Health Maintenance Due  Topic Date Due   COLONOSCOPY (Pts 45-56yrs  Insurance coverage will need to be confirmed)  Never done    There are no preventive care reminders to display for this patient.  Lab Results  Component Value Date   TSH 1.91 04/09/2019   Lab Results  Component Value Date   WBC 3.7 (L) 10/20/2020   HGB 13.6 10/20/2020   HCT 39.6 10/20/2020   MCV 88.2 10/20/2020   PLT 190 10/20/2020   Lab Results  Component Value Date   NA 139 10/20/2020   K 4.2 10/20/2020   CO2 29 10/20/2020   GLUCOSE 84 10/20/2020   BUN 15 10/20/2020   CREATININE 1.19 10/20/2020   BILITOT 0.4 10/20/2020   ALKPHOS 45 07/31/2016   AST 30 10/20/2020   ALT 25 10/20/2020   PROT 7.4 10/20/2020   ALBUMIN 4.2 07/31/2016   CALCIUM 9.4 10/20/2020   ANIONGAP 10 09/28/2020   Lab Results  Component Value Date   CHOL 218 (H) 10/20/2020   Lab Results  Component Value Date   HDL 50 10/20/2020   Lab Results  Component Value Date   LDLCALC 133 (H) 10/20/2020   Lab Results  Component Value Date   TRIG 209 (H) 10/20/2020   Lab Results  Component Value Date   CHOLHDL 4.4 10/20/2020   Lab Results  Component Value Date   HGBA1C 6.7 (A) 02/18/2021      Assessment & Plan:   Problem List Items Addressed This Visit       Endocrine   IFG (impaired fasting glucose)   Relevant Orders   Hemoglobin A1c   Other Visit Diagnoses     Annual physical exam    -  Primary   Relevant Orders   HIV Antibody (routine testing w rflx)   PSA   Lipid panel   COMPLETE METABOLIC PANEL WITH GFR   CBC   Hemoglobin A1c   Screening for HIV (human immunodeficiency virus)       Relevant Orders   HIV Antibody (routine testing w rflx)   Screen for colon cancer       Relevant Orders   Ambulatory referral to Gastroenterology       Keep up a regular exercise program and make sure you are eating a healthy diet Try to eat 4 servings of dairy a day, or  if you are lactose intolerant take a calcium with vitamin D daily.  Your vaccines are up to date.  Cancer screening  options discussed he would like to move forward with colonoscopy. Labs ordered.  No orders of the defined types were placed in this encounter.   Follow-up: Return in about 6 months (around 01/26/2022) for a1c .    Nani Gasser, MD

## 2021-07-27 LAB — HEMOGLOBIN A1C
Hgb A1c MFr Bld: 5.8 % of total Hgb — ABNORMAL HIGH (ref <5.7)
Mean Plasma Glucose: 120 mg/dL
eAG (mmol/L): 6.6 mmol/L

## 2021-07-27 LAB — COMPLETE METABOLIC PANEL WITH GFR
AG Ratio: 1.5 (calc) (ref 1.0–2.5)
ALT: 16 U/L (ref 9–46)
AST: 25 U/L (ref 10–40)
Albumin: 4.3 g/dL (ref 3.6–5.1)
Alkaline phosphatase (APISO): 47 U/L (ref 36–130)
BUN: 12 mg/dL (ref 7–25)
CO2: 28 mmol/L (ref 20–32)
Calcium: 9.2 mg/dL (ref 8.6–10.3)
Chloride: 103 mmol/L (ref 98–110)
Creat: 1.09 mg/dL (ref 0.60–1.29)
Globulin: 2.9 g/dL (calc) (ref 1.9–3.7)
Glucose, Bld: 98 mg/dL (ref 65–99)
Potassium: 4.3 mmol/L (ref 3.5–5.3)
Sodium: 138 mmol/L (ref 135–146)
Total Bilirubin: 0.5 mg/dL (ref 0.2–1.2)
Total Protein: 7.2 g/dL (ref 6.1–8.1)
eGFR: 84 mL/min/{1.73_m2} (ref >=60)

## 2021-07-27 LAB — LIPID PANEL
Cholesterol: 196 mg/dL (ref <200)
HDL: 58 mg/dL (ref >=40)
LDL Cholesterol (Calc): 115 mg/dL (calc) — ABNORMAL HIGH
Non-HDL Cholesterol (Calc): 138 mg/dL (calc) — ABNORMAL HIGH (ref <130)
Total CHOL/HDL Ratio: 3.4 (calc) (ref <5.0)
Triglycerides: 122 mg/dL (ref <150)

## 2021-07-27 LAB — CBC
HCT: 39.6 % (ref 38.5–50.0)
Hemoglobin: 13.2 g/dL (ref 13.2–17.1)
MCH: 29.5 pg (ref 27.0–33.0)
MCHC: 33.3 g/dL (ref 32.0–36.0)
MCV: 88.6 fL (ref 80.0–100.0)
MPV: 12.6 fL — ABNORMAL HIGH (ref 7.5–12.5)
Platelets: 165 10*3/uL (ref 140–400)
RBC: 4.47 10*6/uL (ref 4.20–5.80)
RDW: 12.7 % (ref 11.0–15.0)
WBC: 3.8 10*3/uL (ref 3.8–10.8)

## 2021-07-27 LAB — HIV ANTIBODY (ROUTINE TESTING W REFLEX): HIV 1&2 Ab, 4th Generation: NONREACTIVE

## 2021-07-27 LAB — PSA: PSA: 0.57 ng/mL (ref <=4.00)

## 2021-07-27 NOTE — Progress Notes (Signed)
Hi Brett Hall, overall your labs look great.  Your A1c, which is the 47-month sugar test, looks better.  It was 6.7 and it is back down to 5.8 which is where you have been in the last couple of years so great work purring it.  Cholesterol still a little elevated so just continue to work on healthy diet and regular exercise.  We will follow that A1c carefully I would like to check it again in about 6 months.

## 2021-08-24 ENCOUNTER — Encounter: Payer: Self-pay | Admitting: Internal Medicine

## 2021-10-11 ENCOUNTER — Other Ambulatory Visit: Payer: Self-pay

## 2021-10-11 ENCOUNTER — Encounter: Payer: Self-pay | Admitting: Internal Medicine

## 2021-10-11 ENCOUNTER — Ambulatory Visit (AMBULATORY_SURGERY_CENTER): Payer: BC Managed Care – PPO | Admitting: *Deleted

## 2021-10-11 VITALS — Ht 71.0 in | Wt 194.0 lb

## 2021-10-11 DIAGNOSIS — Z1211 Encounter for screening for malignant neoplasm of colon: Secondary | ICD-10-CM

## 2021-10-11 MED ORDER — NA SULFATE-K SULFATE-MG SULF 17.5-3.13-1.6 GM/177ML PO SOLN: 1.0000 | ORAL | 0 refills | Status: DC

## 2021-10-11 NOTE — Progress Notes (Signed)
Patient's pre-visit was done today over the phone with the patient. Name,DOB and address verified. Patient denies any allergies to Eggs and Soy. Patient denies any problems with anesthesia/sedation. Patient is not taking any diet pills or blood thinners. No home Oxygen. Packet of Prep instructions mailed to patient including a copy of a consent form-pt is aware. Patient understands to call us back with any questions or concerns. Patient is aware of our care-partner policy and Covid-19 safety protocol. Pt denies constipation. Suprep rx sent to pharmacy.   EMMI education assigned to the patient for the procedure, sent to MyChart.

## 2021-10-24 ENCOUNTER — Encounter: Payer: Self-pay | Admitting: Internal Medicine

## 2021-10-24 ENCOUNTER — Ambulatory Visit (AMBULATORY_SURGERY_CENTER): Payer: BC Managed Care – PPO | Admitting: Internal Medicine

## 2021-10-24 VITALS — BP 107/71 | HR 57 | Temp 98.6°F | Resp 14 | Ht 71.0 in | Wt 194.0 lb

## 2021-10-24 DIAGNOSIS — Z1211 Encounter for screening for malignant neoplasm of colon: Secondary | ICD-10-CM | POA: Diagnosis not present

## 2021-10-24 MED ORDER — SODIUM CHLORIDE 0.9 % IV SOLN: 500.0000 mL | Freq: Once | INTRAVENOUS | Status: DC

## 2021-10-24 NOTE — Op Note (Signed)
[facility] Patient Name: Brett Hall Procedure Date: 10/24/2021 12:18 PM MRN: [id number] Endoscopist: [name] , MD Age: 50 Referring MD:  Date of Birth: 08/12/1971 Gender: Male Account #: [id number] Procedure:                Colonoscopy Indications:              Screening for colorectal malignant neoplasm, This                            is the patient's first colonoscopy Medicines:                Propofol per Anesthesia, Monitored Anesthesia Care Procedure:                Pre-Anesthesia Assessment:                           - Prior to the procedure, a History and Physical                            was performed, and patient medications and                            allergies were reviewed. The patient's tolerance of                            previous anesthesia was also reviewed. The risks                            and benefits of the procedure and the sedation                            options and risks were discussed with the patient.                            All questions were answered, and informed consent                            was obtained. Prior Anticoagulants: The patient has                            taken no previous anticoagulant or antiplatelet                            agents. ASA Grade Assessment: II - A patient with                            mild systemic disease. After reviewing the risks                            and benefits, the patient was deemed in                            satisfactory condition to undergo the procedure.  After obtaining informed consent, the colonoscope                            was passed under direct vision. Throughout the                            procedure, the patient's blood pressure, pulse, and                            oxygen saturations were monitored continuously. The                            CF [id number] #[id number] was introduced through the anus                             and advanced to the the cecum, identified by                            appendiceal orifice and ileocecal valve. The                            quality of the bowel preparation was good. The                            colonoscopy was performed without difficulty. The                            patient tolerated the procedure well. The bowel                            preparation used was SUPREP via split dose                            instruction. Scope In: 12:23:32 PM Scope Out: 12:34:38 PM Scope Withdrawal Time: 0 hours 8 minutes 45 seconds  Total Procedure Duration: 0 hours 11 minutes 6 seconds  Findings:                 The perianal and digital rectal examinations were                            normal. Pertinent negatives include normal prostate                            (size, shape, and consistency).                           The colon (entire examined portion) appeared normal.                           No additional abnormalities were found on                            retroflexion. Complications:            No immediate complications. Estimated  blood loss:                            None. Estimated Blood Loss:     Estimated blood loss: none. Impression:               - The entire examined colon is normal.                           - No specimens collected. Recommendation:           - Repeat colonoscopy in 10 years for screening                            purposes.                           - Patient has a contact number available for                            emergencies. The signs and symptoms of potential                            delayed complications were discussed with the                            patient. Return to normal activities tomorrow.                            Written discharge instructions were provided to the                            patient.                           - Resume previous diet.                           - Continue present  medications. [name], MD 10/24/2021 12:39:07 PM This report has been signed electronically.

## 2021-10-24 NOTE — Patient Instructions (Addendum)
I am pleased to tell you that your colonoscopy was normal. No polyps or cancer seen.  Next routine colonoscopy or other screening test in 10 years - 2032.  I appreciate the opportunity to care for you. Brett Boop, MD, [facility]  No repeat colonoscopy for 10 years!! Resume previous diet and continue present medications.   YOU HAD AN ENDOSCOPIC PROCEDURE TODAY AT THE [facility]:   Refer to the procedure report that was given to you for any specific questions about what was found during the examination.  If the procedure report does not answer your questions, please call your gastroenterologist to clarify.  If you requested that your care partner not be given the details of your procedure findings, then the procedure report has been included in a sealed envelope for you to review at your convenience later.  YOU SHOULD EXPECT: Some feelings of bloating in the abdomen. Passage of more gas than usual.  Walking can help get rid of the air that was put into your GI tract during the procedure and reduce the bloating. If you had a lower endoscopy (such as a colonoscopy or flexible sigmoidoscopy) you may notice spotting of blood in your stool or on the toilet paper. If you underwent a bowel prep for your procedure, you may not have a normal bowel movement for a few days.  Please Note:  You might notice some irritation and congestion in your nose or some drainage.  This is from the oxygen used during your procedure.  There is no need for concern and it should clear up in a day or so.  SYMPTOMS TO REPORT IMMEDIATELY:  Following lower endoscopy (colonoscopy or flexible sigmoidoscopy):  Excessive amounts of blood in the stool  Significant tenderness or worsening of abdominal pains  Swelling of the abdomen that is new, acute  Fever of 100F or higher  For urgent or emergent issues, a gastroenterologist can be reached at any hour by calling [phone]. Do not use MyChart messaging for  urgent concerns.    DIET:  We do recommend a small meal at first, but then you may proceed to your regular diet.  Drink plenty of fluids but you should avoid alcoholic beverages for 24 hours.  ACTIVITY:  You should plan to take it easy for the rest of today and you should NOT DRIVE or use heavy machinery until tomorrow (because of the sedation medicines used during the test).    FOLLOW UP: Our staff will call the number listed on your records 48-72 hours following your procedure to check on you and address any questions or concerns that you may have regarding the information given to you following your procedure. If we do not reach you, we will leave a message.  We will attempt to reach you two times.  During this call, we will ask if you have developed any symptoms of COVID 19. If you develop any symptoms (ie: fever, flu-like symptoms, shortness of breath, cough etc.) before then, please call [phone].  If you test positive for Covid 19 in the 2 weeks post procedure, please call and report this information to [place].    If any biopsies were taken you will be contacted by phone or by letter within the next 1-3 weeks.  Please call us at [phone] if you have not heard about the biopsies in 3 weeks.    SIGNATURES/CONFIDENTIALITY: You and/or your care partner have signed paperwork which will be entered into your electronic medical record.  These signatures attest to the fact that that the information above on your After Visit Summary has been reviewed and is understood.  Full responsibility of the confidentiality of this discharge information lies with you and/or your care-partner.  

## 2021-10-24 NOTE — Progress Notes (Signed)
A and O x3. Report to RN. Tolerated MAC anesthesia well. 

## 2021-10-24 NOTE — Progress Notes (Signed)
Pt's states no medical or surgical changes since previsit or office visit. VS by DT. °

## 2021-10-24 NOTE — Progress Notes (Signed)
 Gastroenterology History and Physical   Primary Care Physician:  Agapito Games, MD   Reason for Procedure:   Colon cancer screening  Plan:    colonoscopy     HPI: Brett Hall is a 50 y.o. male here for screening colonoscopy   Past Medical History:  Diagnosis Date   Chest pain, atypical    Femoral hernia    GERD (gastroesophageal reflux disease)    RBBB (right bundle branch block with left anterior fascicular block)    hemblock    Past Surgical History:  Procedure Laterality Date   VARICOCELE EXCISION  2021    Prior to Admission medications   Not on File    No current outpatient medications on file.   Current Facility-Administered Medications  Medication Dose Route Frequency Provider Last Rate Last Admin   0.9 %  sodium chloride infusion  500 mL Intravenous Once [name], MD        Allergies as of 10/24/2021 - Review Complete 10/24/2021  Allergen Reaction Noted   Citric acid Swelling 04/26/2015    Family History  Problem Relation Age of Onset   Coronary artery disease Other        No family history of premature CAD   Diabetes Other    Colon cancer Neg Hx    Esophageal cancer Neg Hx    Stomach cancer Neg Hx     Social History   Socioeconomic History   Marital status: Married    Spouse name: Artist   Number of children: 1   Years of education: Not on file   Highest education level: Not on file  Occupational History   Occupation: Veterinary surgeon  Tobacco Use   Smoking status: Never   Smokeless tobacco: Never  Vaping Use   Vaping Use: Never used  Substance and Sexual Activity   Alcohol use: Yes    Comment: 1-2 times a month   Drug use: Yes    Types: Marijuana    Comment: 1-2 times per month per pt            Social History Narrative   Daughter named Orthoptist    Review of Systems:  All other review of systems negative except as mentioned in the HPI.  Physical Exam: Vital signs BP 126/69   Pulse 80   Temp 98.6 F  (37 C)   Ht 5\' 11"  (1.803 m)   Wt 194 lb (88 kg)   SpO2 98%   BMI 27.06 kg/m   General:   Alert,  Well-developed, well-nourished, pleasant and cooperative in NAD Lungs:  Clear throughout to auscultation.   Heart:  Regular rate and rhythm; no murmurs, clicks, rubs,  or gallops. Abdomen:  Soft, nontender and nondistended. Normal bowel sounds.   Neuro/Psych:  Alert and cooperative. Normal mood and affect. A and O x 3   @Arkeem Harts  , MD, [facility] Gastroenterology [phone] (pager) 10/24/2021 12:15 PM@

## 2021-10-26 ENCOUNTER — Telehealth: Payer: Self-pay

## 2021-10-26 NOTE — Telephone Encounter (Signed)
No answer on follow up call. 

## 2022-01-25 NOTE — Progress Notes (Deleted)
Established Patient Office Visit  Subjective:  Patient ID: Brett Hall, male    DOB: [birth year]  Age: 51 y.o. MRN: [id number]  CC: No chief complaint on file.   HPI Rande Roylance presents for   Impaired fasting glucose-no increased thirst or urination. No symptoms consistent with hypoglycemia.   Past Medical History:  Diagnosis Date   Chest pain, atypical    Femoral hernia    GERD (gastroesophageal reflux disease)    RBBB (right bundle branch block with left anterior fascicular block)    hemblock    Past Surgical History:  Procedure Laterality Date   VARICOCELE EXCISION  2021    Family History  Problem Relation Age of Onset   Coronary artery disease Other        No family history of premature CAD   Diabetes Other    Colon cancer Neg Hx    Esophageal cancer Neg Hx    Stomach cancer Neg Hx     Social History   Socioeconomic History   Marital status: Married    Spouse name: Belinda   Number of children: 1   Years of education: Not on file   Highest education level: Not on file  Occupational History   Occupation: Cabin crew  Tobacco Use   Smoking status: Never   Smokeless tobacco: Never  Vaping Use   Vaping Use: Never used  Substance and Sexual Activity   Alcohol use: Yes    Comment: 1-2 times a month   Drug use: Yes    Types: Marijuana    Comment: 1-2 times per month per pt   Sexual activity: Not on file  Other Topics Concern   Not on file  Social History Narrative   Daughter named Research officer, trade union   Social Determinants of Health   Financial Resource Strain: Not on file  Food Insecurity: Not on file  Transportation Needs: Not on file  Physical Activity: Not on file  Stress: Not on file  Social Connections: Not on file  Intimate Partner Violence: Not on file    No outpatient medications prior to visit.   No facility-administered medications prior to visit.    Allergies  Allergen Reactions   Citric Acid Swelling    Nasal swelling & redness     ROS Review of Systems    Objective:    Physical Exam  There were no vitals taken for this visit. Wt Readings from Last 3 Encounters:  10/24/21 194 lb (88 kg)  10/11/21 194 lb (88 kg)  07/26/21 194 lb (88 kg)     Health Maintenance Due  Topic Date Due   COVID-19 Vaccine (1) Never done   Hepatitis C Screening  Never done    There are no preventive care reminders to display for this patient.  Lab Results  Component Value Date   TSH 1.91 04/09/2019   Lab Results  Component Value Date   WBC 3.8 07/26/2021   HGB 13.2 07/26/2021   HCT 39.6 07/26/2021   MCV 88.6 07/26/2021   PLT 165 07/26/2021   Lab Results  Component Value Date   NA 138 07/26/2021   K 4.3 07/26/2021   CO2 28 07/26/2021   GLUCOSE 98 07/26/2021   BUN 12 07/26/2021   CREATININE 1.09 07/26/2021   BILITOT 0.5 07/26/2021   ALKPHOS 45 07/31/2016   AST 25 07/26/2021   ALT 16 07/26/2021   PROT 7.2 07/26/2021   ALBUMIN 4.2 07/31/2016   CALCIUM 9.2 07/26/2021   ANIONGAP 10 09/28/2020  EGFR 84 07/26/2021   Lab Results  Component Value Date   CHOL 196 07/26/2021   Lab Results  Component Value Date   HDL 58 07/26/2021   Lab Results  Component Value Date   LDLCALC 115 (H) 07/26/2021   Lab Results  Component Value Date   TRIG 122 07/26/2021   Lab Results  Component Value Date   CHOLHDL 3.4 07/26/2021   Lab Results  Component Value Date   HGBA1C 5.8 (H) 07/26/2021      Assessment & Plan:   Problem List Items Addressed This Visit       Endocrine   IFG (impaired fasting glucose) - Primary    No orders of the defined types were placed in this encounter.   Follow-up: No follow-ups on file.    Beatrice Lecher, MD

## 2022-01-26 ENCOUNTER — Ambulatory Visit: Payer: BC Managed Care – PPO | Admitting: Family Medicine

## 2022-01-26 DIAGNOSIS — R7301 Impaired fasting glucose: Secondary | ICD-10-CM

## 2022-01-30 DIAGNOSIS — W260XXA Contact with knife, initial encounter: Secondary | ICD-10-CM | POA: Diagnosis not present

## 2022-01-30 DIAGNOSIS — S61211A Laceration without foreign body of left index finger without damage to nail, initial encounter: Secondary | ICD-10-CM | POA: Diagnosis not present

## 2022-04-02 IMAGING — CT CT NECK WO/W CM
4 of 8 series · 10 of 33 positions shown, 11 images · IV contrast (omnipaque)
Comparison: None.

CLINICAL DATA: Right-sided neck mass

EXAM:
CT NECK WITH CONTRAST
TECHNIQUE: Multidetector CT imaging of the neck was performed with intravenous
contrast.
CONTRAST:  100mL OMNIPAQUE IOHEXOL 300 MG/ML  SOLN

[Series 5: axial · axial · 0.39mm/px · z∈[+1106,+1207]mm · 2 of 155 slices shown, 3 images (1 of 2)]
[im 52/155  soft-tissue]
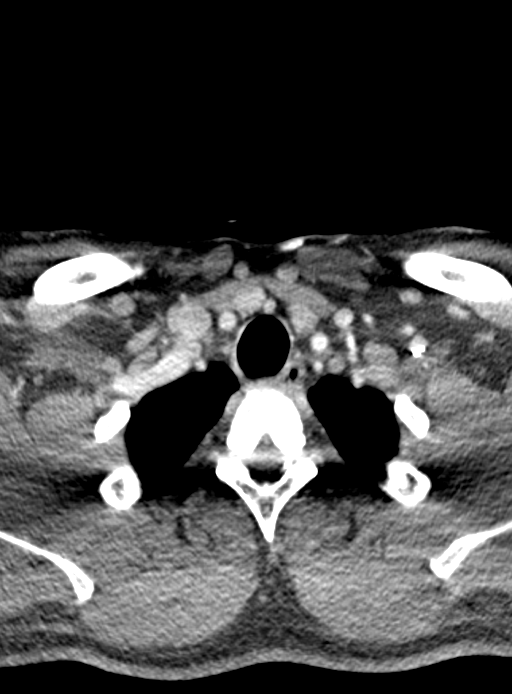
[im 52/155  bone]
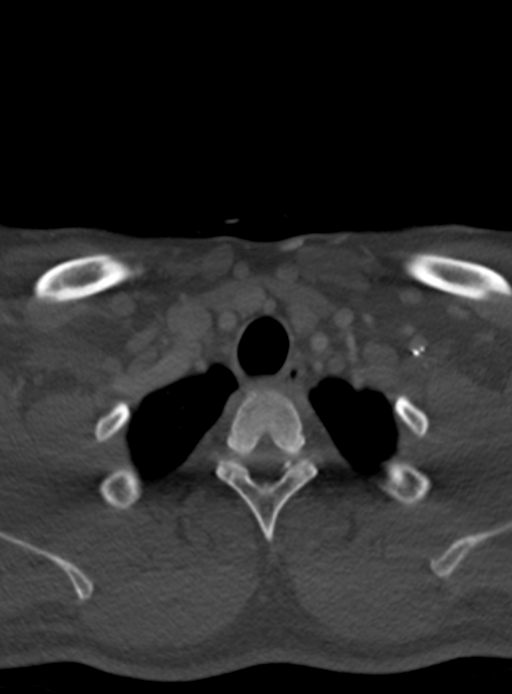
[im 103/155  bone]
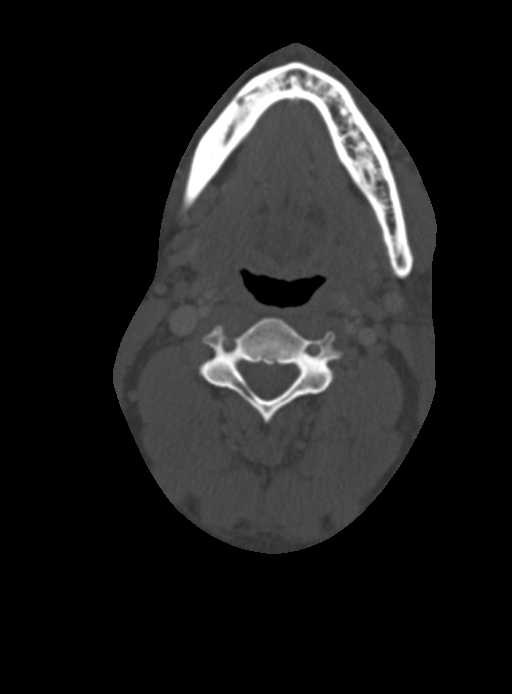

[Series 6: coronal · coronal · 0.39mm/px · 1 of 136 slices shown]
[im 68/136  bone]
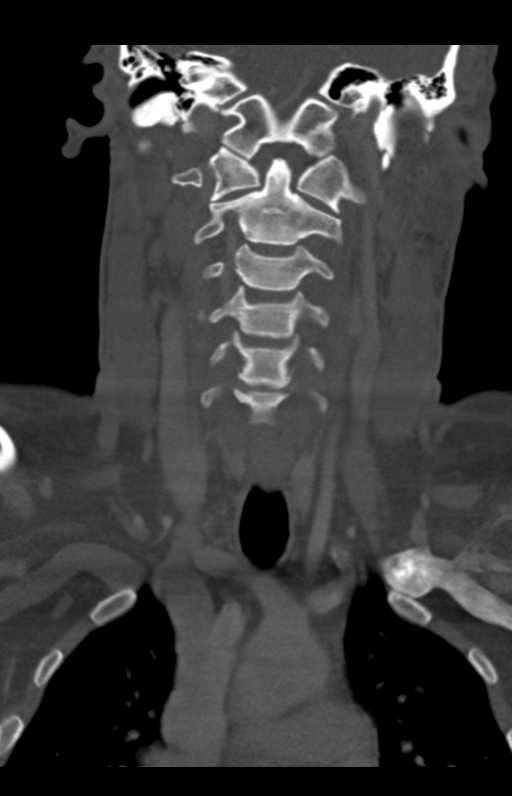

[Series 7: sagittal · sagittal · 0.53mm/px · 5 of 101 slices shown]
[im 17/101  bone]
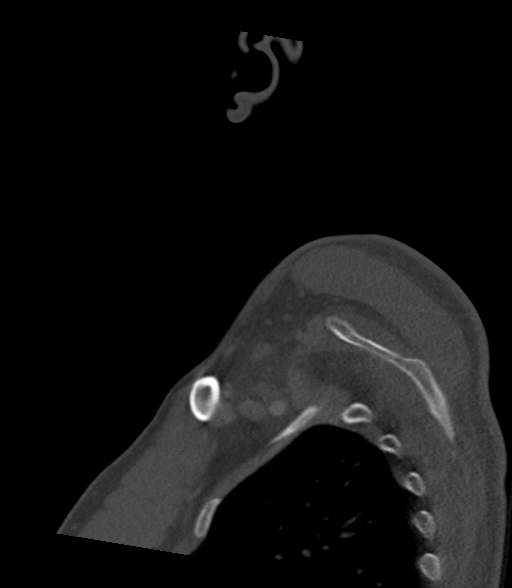
[im 34/101  bone]
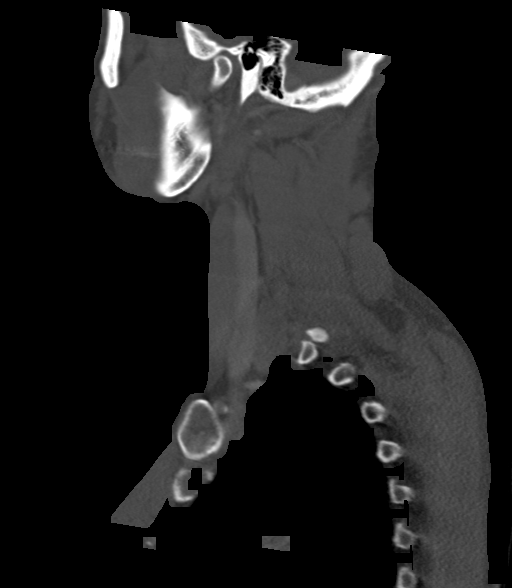
[im 51/101  bone]
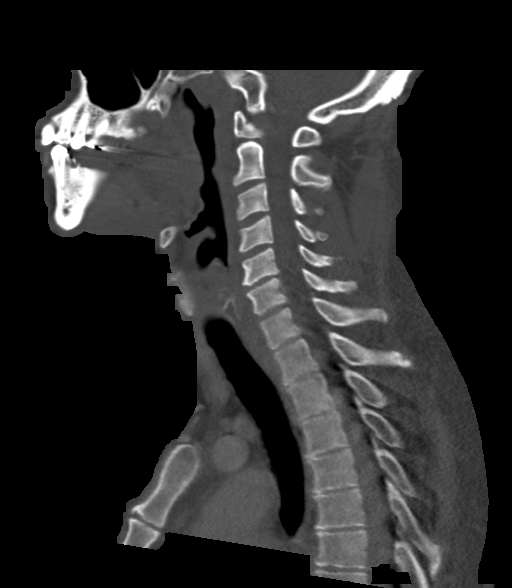
[im 67/101  bone]
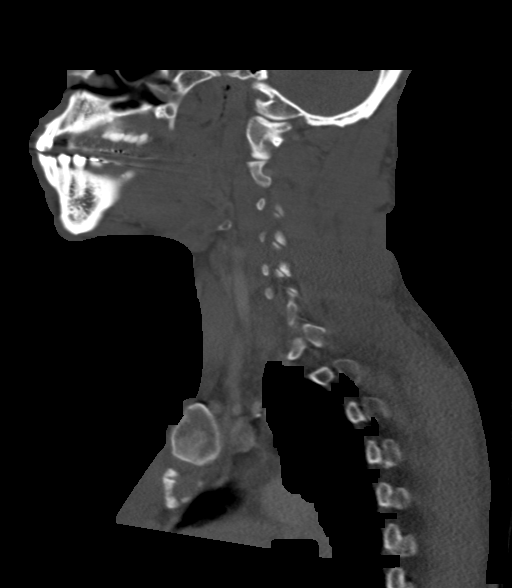
[im 84/101  bone]
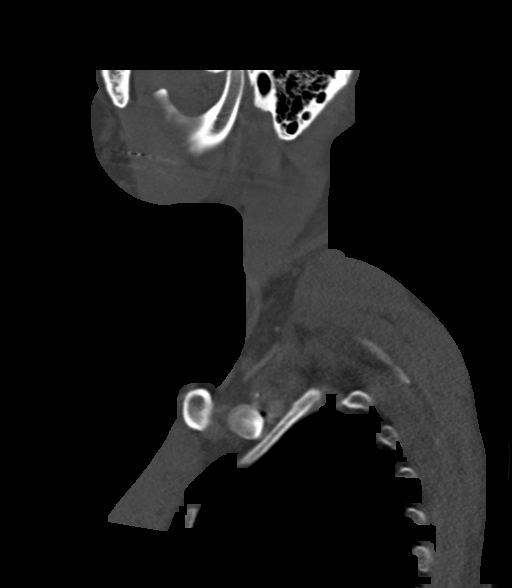

[Series 11: axial · axial · 0.39mm/px · z∈[+1105,+1208]mm · 2 of 156 slices shown (2 of 2)]
[im 52/156  bone]
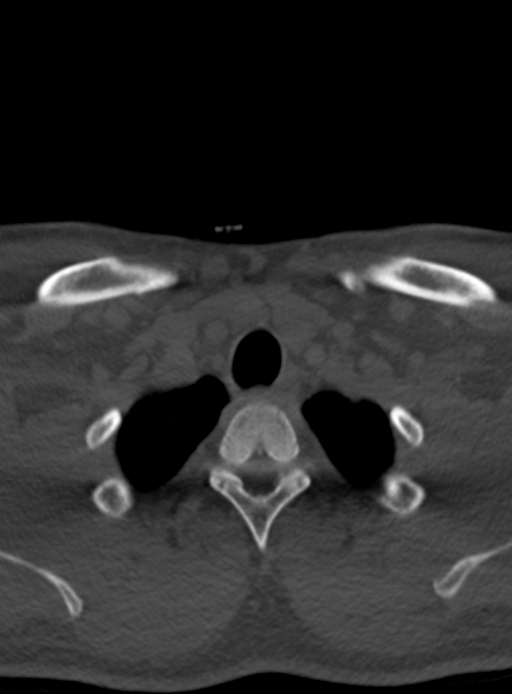
[im 104/156  bone]
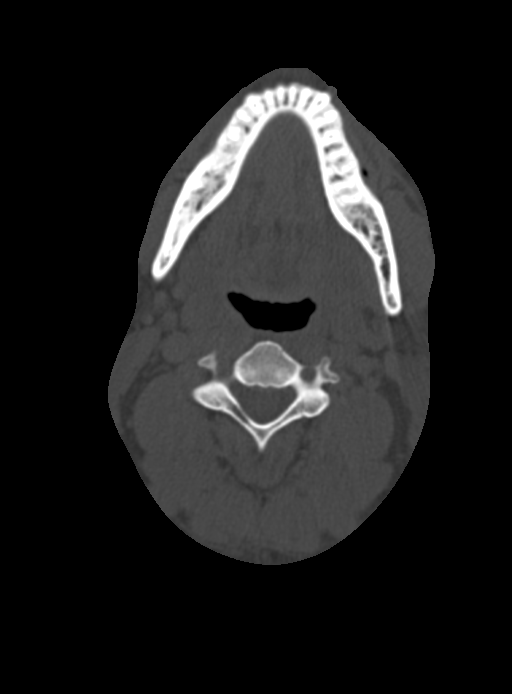

[10 of 33 positions shown; findings below may reference images not displayed]

FINDINGS: PHARYNX AND LARYNX: The nasopharynx, oropharynx and larynx are
normal. Visible portions of the oral cavity, tongue base and floor
of mouth are normal. Normal epiglottis, vallecula and pyriform
sinuses. The larynx is normal. No retropharyngeal abscess, effusion
or lymphadenopathy.

SALIVARY GLANDS: Normal parotid, submandibular and sublingual
glands.

THYROID: Normal.

LYMPH NODES: No enlarged or abnormal density lymph nodes.

VASCULAR: Major cervical vessels are patent.

LIMITED INTRACRANIAL: Normal.

VISUALIZED ORBITS: Normal.

MASTOIDS AND VISUALIZED PARANASAL SINUSES: No fluid levels or
advanced mucosal thickening. No mastoid effusion.

SKELETON: No bony spinal canal stenosis. No lytic or blastic
lesions.

UPPER CHEST: Clear.

OTHER: None.
IMPRESSION: Normal CT of the neck.

## 2022-07-06 ENCOUNTER — Ambulatory Visit (INDEPENDENT_AMBULATORY_CARE_PROVIDER_SITE_OTHER): Payer: BC Managed Care – PPO

## 2022-07-06 ENCOUNTER — Other Ambulatory Visit: Payer: Self-pay

## 2022-07-06 ENCOUNTER — Encounter (HOSPITAL_COMMUNITY): Payer: Self-pay | Admitting: *Deleted

## 2022-07-06 ENCOUNTER — Ambulatory Visit (HOSPITAL_COMMUNITY)
Admission: EM | Admit: 2022-07-06 | Discharge: 2022-07-06 | Disposition: A | Discharge status: Home / Self Care | Payer: BC Managed Care – PPO | Attending: Family Medicine | Admitting: Family Medicine

## 2022-07-06 DIAGNOSIS — R079 Chest pain, unspecified: Secondary | ICD-10-CM

## 2022-07-06 NOTE — Discharge Instructions (Signed)
You have been seen at the [facility] today for chest pain. Your evaluation today was not suggestive of any emergent condition requiring medical intervention at this time. Your chest x-ray and ECG (heart tracing) did not show any worrisome changes. However, some medical problems make take more time to appear. Therefore, it's very important that you pay attention to any new symptoms or worsening of your current condition.  Please proceed directly to the Emergency Department immediately should you feel worse in any way or have any of the following symptoms: increasing or different chest pain, pain that spreads to your arm, neck, jaw, back or abdomen, shortness of breath, or nausea and vomiting.  

## 2022-07-06 NOTE — ED Triage Notes (Signed)
Pt reports CP since last night the patient reports this CP is different from CP he has had in the past.

## 2022-07-06 NOTE — ED Provider Notes (Signed)
[facility]   [id number] 07/06/22 Arrival Time: 1028  ASSESSMENT & PLAN:  1. Chest pain, unspecified type    Very low suspicion of cardiac related pain. Discussed.  ECG: Performed today and interpreted by me: normal EKG, normal sinus rhythm. No STEMI.  I have personally viewed the imaging studies ordered this visit. Normal CXR. No pneumothorax.  After discussion, he would appreciated seeing a cardiologist for further reassurance. Referral placed to [facility].    Discharge Instructions      You have been seen at the [facility] today for chest pain. Your evaluation today was not suggestive of any emergent condition requiring medical intervention at this time. Your chest x-ray and ECG (heart tracing) did not show any worrisome changes. However, some medical problems make take more time to appear. Therefore, it's very important that you pay attention to any new symptoms or worsening of your current condition.  Please proceed directly to the Emergency Department immediately should you feel worse in any way or have any of the following symptoms: increasing or different chest pain, pain that spreads to your arm, neck, jaw, back or abdomen, shortness of breath, or nausea and vomiting.      Reviewed expectations re: course of current medical issues. Questions answered. Outlined signs and symptoms indicating need for more acute intervention. Patient verbalized understanding. After Visit Summary given.   SUBJECTIVE:  History from: patient. Brett Hall is a 51 y.o. male who presents with complaint of intermittent central chest discomfort; "not pain, just feels a little uncomfortable". Sporadic. Dull in nature. Non-radiating. No assoc SOB/n/v/diaphoresis. No worsening since first noting this yest evening. Able to sleep through the night. No injury/trauma. Denies: claudication, fatigue, irregular heart beat, lower extremity edema, near-syncope,  orthopnea, palpitations, and syncope. Denies recreational drug use.  Social History   Tobacco Use  Smoking Status Never  Smokeless Tobacco Never   Social History   Substance and Sexual Activity  Alcohol Use Yes   Comment: 1-2 times a month    OBJECTIVE:  Vitals:   07/06/22 1041  BP: 128/85  Pulse: 73  Resp: 18  Temp: (!) 97.5 F (36.4 C)  SpO2: 97%    General appearance: alert, oriented, no acute distress; healthy-appearing Eyes: PERRLA; EOMI; conjunctivae normal HENT: normocephalic; atraumatic Neck: supple with FROM Lungs: without labored respirations; speaks full sentences without difficulty; CTAB Heart: regular rate and rhythm without murmer Chest Wall: without tenderness to palpation Abdomen: soft, non-tender; no guarding or rebound tenderness Extremities: without edema; without calf swelling or tenderness; symmetrical without gross deformities Skin: warm and dry; without rash or lesions Neuro: normal gait Psychological: alert and cooperative; normal mood and affect   Allergies  Allergen Reactions   Citric Acid Swelling    Nasal swelling & redness    Past Medical History:  Diagnosis Date   Chest pain, atypical    Femoral hernia    GERD (gastroesophageal reflux disease)    RBBB (right bundle branch block with left anterior fascicular block)    hemblock   Social History   Socioeconomic History   Marital status: Married    Spouse name: Belinda   Number of children: 1   Years of education: Not on file   Highest education level: Not on file  Occupational History   Occupation: Realtor  Tobacco Use   Smoking status: Never   Smokeless tobacco: Never  Vaping Use   Vaping Use: Never used  Substance and Sexual Activity   Alcohol use:  Yes    Comment: 1-2 times a month   Drug use: Yes    Types: Marijuana    Comment: 1-2 times per month per pt   Sexual activity: Not on file  Other Topics Concern   Not on file  Social History Narrative   Daughter  named Orthoptist   Social Determinants of Health   Financial Resource Strain: Not on file  Food Insecurity: Not on file  Transportation Needs: Not on file  Physical Activity: Not on file  Stress: Not on file  Social Connections: Not on file  Intimate Partner Violence: Not on file   Family History  Problem Relation Age of Onset   Coronary artery disease Other        No family history of premature CAD   Diabetes Other    Colon cancer Neg Hx    Esophageal cancer Neg Hx    Stomach cancer Neg Hx    Past Surgical History:  Procedure Laterality Date   VARICOCELE EXCISION  2021      Mardella Layman, MD 07/06/22 1925

## 2022-07-13 ENCOUNTER — Encounter: Payer: Self-pay | Admitting: Internal Medicine

## 2022-07-13 ENCOUNTER — Ambulatory Visit: Payer: BC Managed Care – PPO | Admitting: Internal Medicine

## 2022-07-13 VITALS — BP 120/78 | HR 90 | Temp 98.7°F | Resp 16 | Ht 71.0 in | Wt 199.2 lb

## 2022-07-13 DIAGNOSIS — E785 Hyperlipidemia, unspecified: Secondary | ICD-10-CM | POA: Diagnosis not present

## 2022-07-13 DIAGNOSIS — R002 Palpitations: Secondary | ICD-10-CM | POA: Diagnosis not present

## 2022-07-13 DIAGNOSIS — R079 Chest pain, unspecified: Secondary | ICD-10-CM | POA: Diagnosis not present

## 2022-07-13 MED ORDER — ROSUVASTATIN CALCIUM 10 MG PO TABS: 10.0000 mg | ORAL_TABLET | Freq: Every day | ORAL | 2 refills | Status: DC

## 2022-07-13 NOTE — Patient Instructions (Addendum)
Will start Crestor 10mg . Encourage low-sodium diet, less than 2000 mg daily. Schedule imaging tests in office - echo. Labs ordered. Follow-up in 12 months or sooner if needed.

## 2022-07-13 NOTE — Progress Notes (Signed)
Primary Physician/Referring:  Hali Marry, MD  Patient ID: Brett Hall, male    DOB: 10/27/1971, 51 y.o.   MRN: [id number]  Chief Complaint  Patient presents with   Intermittent CP   New Patient (Initial Visit)    Referred by Dr. Nevada Crane   HPI:    Brett Hall  is a 51 y.o. M with past medical history significant for pre-diabetes, HLD, and chest pain who is here to establish care with cardiology.  Today he denies chest pain, shortness of breath, palpitations, diaphoresis, syncope, edema, PND, orthopnea.  He went to the emergency department for an episode of chest pain which concerned him.  Patient had very elevated cholesterol levels and hemoglobin A1c in the last 2 years.  He states that he has drastically changed his diet and lifestyle.  Patient does have a family history of heart disease.  He does go to the gym regularly and works out vigorously for 1 hour without issue.  He has not had lab work in a year publish care.  Past Medical History:  Diagnosis Date   Chest pain, atypical    Femoral hernia    GERD (gastroesophageal reflux disease)    RBBB (right bundle branch block with left anterior fascicular block)    hemblock   Past Surgical History:  Procedure Laterality Date   VARICOCELE EXCISION  2021   Family History  Problem Relation Age of Onset   Coronary artery disease Other        No family history of premature CAD   Diabetes Other    Colon cancer Neg Hx    Esophageal cancer Neg Hx    Stomach cancer Neg Hx     Social History   Tobacco Use   Smoking status: Never   Smokeless tobacco: Never  Substance Use Topics   Alcohol use: Yes    Comment: 1-2 times a month   Marital Status: Married  ROS  Review of Systems  Cardiovascular:  Positive for chest pain and palpitations.  All other systems reviewed and are negative. Objective  Blood pressure 120/78, pulse 90, temperature 98.7 F (37.1 C), temperature source Temporal, resp. rate 16, height  _0  (1.803 m), weight 199 lb 3.2 oz (90.4 kg), SpO2 97 %. Body mass index is 27.78 kg/m.     07/13/2022   12:42 PM 07/06/2022   10:41 AM 10/24/2021   12:56 PM  Vitals with BMI  Height _1     Weight 199 lbs 3 oz    BMI 56.4    Systolic 332 951 884  Diastolic 78 85 71  Pulse 90 73 57     Physical Exam Vitals reviewed.  Constitutional:      Appearance: Normal appearance. He is normal weight. He is not diaphoretic.  HENT:     Head: Normocephalic and atraumatic.  Eyes:     Extraocular Movements: Extraocular movements intact.  Neck:     Vascular: No carotid bruit.  Cardiovascular:     Rate and Rhythm: Normal rate and regular rhythm.     Pulses: Normal pulses.     Heart sounds: Normal heart sounds. No murmur heard.    No friction rub. No gallop.  Pulmonary:     Effort: Pulmonary effort is normal.     Breath sounds: Normal breath sounds.  Abdominal:     General: Abdomen is flat. Bowel sounds are normal.     Palpations: Abdomen is soft.  Skin:    General: Skin is  warm and dry.  Neurological:     Mental Status: He is alert.   Medications and allergies   Allergies  Allergen Reactions   Citric Acid Swelling    Nasal swelling & redness     Medication list after today's encounter   Current Outpatient Medications:    rosuvastatin (CRESTOR) 10 MG tablet, Take 1 tablet (10 mg total) by mouth daily., Disp: 30 tablet, Rfl: 2  Laboratory examination:   Lab Results  Component Value Date   NA 138 07/26/2021   K 4.3 07/26/2021   CO2 28 07/26/2021   GLUCOSE 98 07/26/2021   BUN 12 07/26/2021   CREATININE 1.09 07/26/2021   CALCIUM 9.2 07/26/2021   EGFR 84 07/26/2021   GFRNONAA 72 10/20/2020       Latest Ref Rng & Units 07/26/2021   12:00 AM 10/20/2020   12:00 AM 09/28/2020    9:25 PM  CMP  Glucose 65 - 99 mg/dL 98  84  102   BUN 7 - 25 mg/dL _0 Creatinine 0.60 - 1.29 mg/dL 1.09  1.19  1.11   Sodium 135 - 146 mmol/L 138  139  136   Potassium 3.5 - 5.3  mmol/L 4.3  4.2  4.1   Chloride 98 - 110 mmol/L 103  102  100   CO2 20 - 32 mmol/L _1 Calcium 8.6 - 10.3 mg/dL 9.2  9.4  9.4   Total Protein 6.1 - 8.1 g/dL 7.2  7.4    Total Bilirubin 0.2 - 1.2 mg/dL 0.5  0.4    AST 10 - 40 U/L 25  30    ALT 9 - 46 U/L 16  25        Latest Ref Rng & Units 07/26/2021   12:00 AM 10/20/2020   12:00 AM 08/14/2019   11:54 AM  CBC  WBC 3.8 - 10.8 Thousand/uL 3.8  3.7  4.4   Hemoglobin 13.2 - 17.1 g/dL 13.2  13.6  14.0   Hematocrit 38.5 - 50.0 % 39.6  39.6  42.4   Platelets 140 - 400 Thousand/uL 165  190  146     Lipid Panel Recent Labs    07/26/21 0000  CHOL 196  TRIG 122  LDLCALC 115*  HDL 58  CHOLHDL 3.4    HEMOGLOBIN A1C Lab Results  Component Value Date   HGBA1C 5.8 (H) 07/26/2021   MPG 120 07/26/2021   TSH No results for input(s): "TSH" in the last 8760 hours.  External labs:     Radiology:    Cardiac Studies:   No results found for this or any previous visit from the past 1095 days.     No results found for this or any previous visit from the past 1095 days.     EKG:     07/13/22 EKG - NSR, normal EKG  Assessment     ICD-10-CM   1. Chest pain of uncertain etiology  O45.9 EKG 97-FSFS    Basic metabolic panel    High sensitivity CRP    Hgb A1c w/o eAG    Lipid Panel With LDL/HDL Ratio    PCV ECHOCARDIOGRAM COMPLETE       Orders Placed This Encounter  Procedures   Basic metabolic panel   High sensitivity CRP   Hgb A1c w/o eAG   Lipid Panel With LDL/HDL Ratio   EKG 12-Lead   PCV ECHOCARDIOGRAM COMPLETE    Standing  Status:   Future    Standing Expiration Date:   07/14/2023    Meds ordered this encounter  Medications   rosuvastatin (CRESTOR) 10 MG tablet    Sig: Take 1 tablet (10 mg total) by mouth daily.    Dispense:  30 tablet    Refill:  2    There are no discontinued medications.   Recommendations:   [name] is a 51 y.o.  M with chest pain, pre-diabetes, and HLD  Will start  Crestor 62m. Encourage low-sodium diet, less than 2000 mg daily. Schedule imaging tests in office - echo. Labs ordered. Follow-up in 12 months or sooner if needed.     [name] DO  07/13/2022, 1:05 PM Office: [phone]: [phone]

## 2022-07-27 ENCOUNTER — Encounter: Payer: Self-pay | Admitting: Family Medicine

## 2022-07-27 ENCOUNTER — Ambulatory Visit (INDEPENDENT_AMBULATORY_CARE_PROVIDER_SITE_OTHER): Payer: BC Managed Care – PPO | Admitting: Family Medicine

## 2022-07-27 VITALS — BP 115/72 | HR 67 | Ht 71.0 in | Wt 193.0 lb

## 2022-07-27 DIAGNOSIS — Z0000 Encounter for general adult medical examination without abnormal findings: Secondary | ICD-10-CM

## 2022-07-27 NOTE — Progress Notes (Signed)
Complete physical exam  Patient: Brett Hall   DOB: Mar 12, 1971   51 y.o. Male  MRN: [id number]  Subjective:    Chief Complaint  Patient presents with   Annual Exam    Brett Hall is a 51 y.o. male who presents today for a complete physical exam. He reports consuming a general diet.  Works out 3-5 days per week  He generally feels well. He reports sleeping well. He does not have additional problems to discuss today.   He did want to mention that he has cyclical headaches.  He says that every 1 to 2 months he will get a headache that is almost always on the left side of his head he describes it as dull, not throbbing.  It always last 2 to 3 days and then it goes away.  He really does not have any headaches in between.  He says he is not sensitive to light or sound but he does feel better if he lays down and rests if he can.  He denies any nausea with the headaches.  He does have some occasional low back pain he says he injured himself while exercising and then took a break for couple of weeks and it has been better since then.  Most recent fall risk assessment:    07/27/2022   11:12 AM  Fall Risk   Falls in the past year? 0  Number falls in past yr: 0  Injury with Fall? 0  Risk for fall due to : No Fall Risks     Most recent depression screenings:    07/27/2022   11:12 AM 07/26/2021   11:03 AM  PHQ 2/9 Scores  PHQ - 2 Score 0 0      Past Medical History:  Diagnosis Date   Chest pain, atypical    Femoral hernia    GERD (gastroesophageal reflux disease)    RBBB (right bundle branch block with left anterior fascicular block)    hemblock   Past Surgical History:  Procedure Laterality Date   VARICOCELE EXCISION  2021   Allergies  Allergen Reactions   Citric Acid Swelling    Nasal swelling & redness      Patient Care Team: Agapito Games, MD as PCP - General (Family Medicine)   Outpatient Medications Prior to Visit  Medication Sig   [DISCONTINUED]  rosuvastatin (CRESTOR) 10 MG tablet Take 1 tablet (10 mg total) by mouth daily. (Patient not taking: Reported on 07/27/2022)   No facility-administered medications prior to visit.    ROS        Objective:     BP 115/72   Pulse 67   Ht 5\' 11"  (1.803 m)   Wt 193 lb (87.5 kg)   SpO2 98%   BMI 26.92 kg/m     Physical Exam Vitals reviewed.  Constitutional:      Appearance: Normal appearance. He is well-developed.  HENT:     Head: Normocephalic and atraumatic.     Right Ear: Tympanic membrane, ear canal and external ear normal.     Left Ear: Tympanic membrane, ear canal and external ear normal.     Nose: Nose normal.     Mouth/Throat:     Pharynx: Oropharynx is clear.  Eyes:     Conjunctiva/sclera: Conjunctivae normal.     Pupils: Pupils are equal, round, and reactive to light.  Neck:     Thyroid: No thyromegaly.  Cardiovascular:     Rate and Rhythm: Normal rate and  regular rhythm.     Heart sounds: Normal heart sounds.  Pulmonary:     Effort: Pulmonary effort is normal.     Breath sounds: Normal breath sounds.  Abdominal:     General: Bowel sounds are normal. There is no distension.     Palpations: Abdomen is soft. There is no mass.     Tenderness: There is no abdominal tenderness. There is no guarding or rebound.  Musculoskeletal:        General: Normal range of motion.     Cervical back: Normal range of motion and neck supple.  Lymphadenopathy:     Cervical: No cervical adenopathy.  Skin:    General: Skin is warm and dry.     Coloration: Skin is not pale.  Neurological:     Mental Status: He is alert and oriented to person, place, and time.     Deep Tendon Reflexes: Reflexes are normal and symmetric.  Psychiatric:        Behavior: Behavior normal.        Thought Content: Thought content normal.        Judgment: Judgment normal.      No results found for any visits on 07/27/22.      Assessment & Plan:    Routine Health Maintenance and Physical  Exam  Immunization History  Administered Date(s) Administered   Hepatitis A 06/14/2009   Hepatitis B 06/14/2009   Influenza,inj,Quad PF,6+ Mos 08/23/2017   PPD Test 07/10/2016   Td 06/14/2009   Tdap 04/20/2020    Health Maintenance  Topic Date Due   COVID-19 Vaccine (1) Never done   Hepatitis C Screening  Never done   TETANUS/TDAP  04/20/2030   COLONOSCOPY (Pts 45-51yrs Insurance coverage will need to be confirmed)  10/25/2031   HIV Screening  Completed   Pneumococcal Vaccine 63-28 Years old  Aged Out   HPV VACCINES  Aged Out   INFLUENZA VACCINE  Discontinued    Discussed health benefits of physical activity, and encouraged him to engage in regular exercise appropriate for his age and condition.  Problem List Items Addressed This Visit   None Visit Diagnoses     Wellness examination    -  Primary   Relevant Orders   PSA   Lipid panel   COMPLETE METABOLIC PANEL WITH GFR   CBC   Hemoglobin A1c       Keep up a regular exercise program and make sure you are eating a healthy diet Try to eat 4 servings of dairy a day, or if you are lactose intolerant take a calcium with vitamin D daily.  Your vaccines are up to date.   Headaches do not sound concerning for any underlying brain mass or lesion which is what he was concerned about.  They do not quite fit criteria for migraines but do have some features.  If become more frequent or more progressive then please let me know.  Return in about 1 year (around 07/28/2023) for Wellness Exam.     Nani Gasser, MD

## 2022-07-28 LAB — LIPID PANEL
Cholesterol: 190 mg/dL (ref <200)
HDL: 58 mg/dL (ref >=40)
LDL Cholesterol (Calc): 112 mg/dL (calc) — ABNORMAL HIGH
Non-HDL Cholesterol (Calc): 132 mg/dL (calc) — ABNORMAL HIGH (ref <130)
Total CHOL/HDL Ratio: 3.3 (calc) (ref <5.0)
Triglycerides: 94 mg/dL (ref <150)

## 2022-07-28 LAB — COMPLETE METABOLIC PANEL WITH GFR
AG Ratio: 1.5 (calc) (ref 1.0–2.5)
ALT: 14 U/L (ref 9–46)
AST: 25 U/L (ref 10–40)
Albumin: 4.3 g/dL (ref 3.6–5.1)
Alkaline phosphatase (APISO): 47 U/L (ref 36–130)
BUN: 16 mg/dL (ref 7–25)
CO2: 26 mmol/L (ref 20–32)
Calcium: 9.3 mg/dL (ref 8.6–10.3)
Chloride: 104 mmol/L (ref 98–110)
Creat: 1.29 mg/dL (ref 0.60–1.29)
Globulin: 2.9 g/dL (calc) (ref 1.9–3.7)
Glucose, Bld: 87 mg/dL (ref 65–139)
Potassium: 4.8 mmol/L (ref 3.5–5.3)
Sodium: 139 mmol/L (ref 135–146)
Total Bilirubin: 0.4 mg/dL (ref 0.2–1.2)
Total Protein: 7.2 g/dL (ref 6.1–8.1)
eGFR: 68 mL/min/{1.73_m2} (ref >=60)

## 2022-07-28 LAB — CBC
HCT: 40.2 % (ref 38.5–50.0)
Hemoglobin: 13.5 g/dL (ref 13.2–17.1)
MCH: 29.7 pg (ref 27.0–33.0)
MCHC: 33.6 g/dL (ref 32.0–36.0)
MCV: 88.4 fL (ref 80.0–100.0)
MPV: 12.3 fL (ref 7.5–12.5)
Platelets: 174 10*3/uL (ref 140–400)
RBC: 4.55 10*6/uL (ref 4.20–5.80)
RDW: 12.6 % (ref 11.0–15.0)
WBC: 3.5 10*3/uL — ABNORMAL LOW (ref 3.8–10.8)

## 2022-07-28 LAB — HEMOGLOBIN A1C
Hgb A1c MFr Bld: 5.9 % of total Hgb — ABNORMAL HIGH (ref <5.7)
Mean Plasma Glucose: 123 mg/dL
eAG (mmol/L): 6.8 mmol/L

## 2022-07-28 LAB — PSA: PSA: 1.26 ng/mL (ref <=4.00)

## 2022-07-28 NOTE — Progress Notes (Signed)
Hi Duwane, LDL is just slightly elevated similar to last year.  Not high enough that we need to change anything at this point.  Blood cell count is a little bit on the lower end but that seems to be pretty typical for you you to always tend to run on the low end of normal so that is perfectly fine.  That is not worrisome.  A1c is still in the prediabetes range at 5.9 so continue to work on cutting back on carbs and sweets.  And we will plan to recheck that again in 6 months.  State test is normal.  Metabolic panel normal.

## 2022-08-03 ENCOUNTER — Ambulatory Visit: Payer: BC Managed Care – PPO | Admitting: Family Medicine

## 2022-08-04 ENCOUNTER — Ambulatory Visit (INDEPENDENT_AMBULATORY_CARE_PROVIDER_SITE_OTHER): Payer: BC Managed Care – PPO

## 2022-08-04 ENCOUNTER — Encounter: Payer: Self-pay | Admitting: Family Medicine

## 2022-08-04 ENCOUNTER — Ambulatory Visit (INDEPENDENT_AMBULATORY_CARE_PROVIDER_SITE_OTHER): Payer: BC Managed Care – PPO | Admitting: Family Medicine

## 2022-08-04 VITALS — BP 123/58 | HR 70 | Ht 71.0 in | Wt 198.0 lb

## 2022-08-04 DIAGNOSIS — R1032 Left lower quadrant pain: Secondary | ICD-10-CM

## 2022-08-04 NOTE — Progress Notes (Signed)
Brett Hall, no concerning findings.  Just some stool there.  I know you said that your bowels are moving normally so no worries.  Just make sure you are staying really well-hydrated and avoid gas producing foods.

## 2022-08-04 NOTE — Progress Notes (Signed)
   Acute Office Visit  Subjective:     Patient ID: Brett Hall, male    DOB: Mar 27, 1971, 50 y.o.   MRN: [id number]  Chief Complaint  Patient presents with   Abdominal Pain    HPI Patient is in today for left-sided abdominal pain.Started Monday.   He was driving back from [place] and felt sudden pain in that left lower quadrant radiating towards the groin crease.  He said it just lasted a few seconds and then away.  He says since then he has felt just a "light ache.  The initial pain that occurred on Monday he described as a tightening sharp pain.  He had a similar episode that occurred a couple of weeks ago on that left side that woke him up out of sleep again just for few seconds.  He denies any recent change in bowel movements.  No nausea or vomiting.  No blood in the stool or urine. He just had labs done about a weeka go.   ROS      Objective:    BP (!) 123/58 (BP Location: Left Arm, Patient Position: Sitting, Cuff Size: Normal)   Pulse 70   Ht 5\' 11"  (1.803 m)   Wt 198 lb (89.8 kg)   SpO2 95%   BMI 27.62 kg/m    Physical Exam Constitutional:      Appearance: He is well-developed.  HENT:     Head: Normocephalic and atraumatic.  Cardiovascular:     Rate and Rhythm: Normal rate and regular rhythm.     Heart sounds: Normal heart sounds.  Pulmonary:     Effort: Pulmonary effort is normal.     Breath sounds: Normal breath sounds.  Abdominal:     General: Bowel sounds are normal.     Palpations: Abdomen is soft.     Tenderness: There is abdominal tenderness.     Comments: Palpating the left lower quadrant he had pain that radiated to the right.  O/w nontender.  Skin:    General: Skin is warm and dry.  Neurological:     Mental Status: He is alert and oriented to person, place, and time.  Psychiatric:        Behavior: Behavior normal.     No results found for any visits on 08/04/22.      Assessment & Plan:   Problem List Items Addressed This Visit    None Visit Diagnoses     LLQ pain    -  Primary   Relevant Orders   DG Abd 1 View      Left lower quadrant pain he has not had 2 episodes but both just lasted for seconds and then resolved.  Neither 1 of those were with motion or bending or lifting.  The first time was when he was in bed the second time while driving.  I was not able to elicit any discomfort with movement of the hip.  No we discussed getting a KUB even though bowel movements are normal just to rule out obstruction blockage or excess stool in the colon.  Recent labs are very reassuring.  Call if pain continues.  No orders of the defined types were placed in this encounter.   No follow-ups on file.  10/04/22, MD

## 2022-08-17 ENCOUNTER — Other Ambulatory Visit: Payer: BC Managed Care – PPO

## 2022-09-01 ENCOUNTER — Ambulatory Visit: Payer: BC Managed Care – PPO | Admitting: Internal Medicine

## 2022-09-15 ENCOUNTER — Ambulatory Visit: Payer: BC Managed Care – PPO | Admitting: Internal Medicine

## 2022-09-21 ENCOUNTER — Other Ambulatory Visit: Payer: BC Managed Care – PPO

## 2022-12-26 ENCOUNTER — Ambulatory Visit (INDEPENDENT_AMBULATORY_CARE_PROVIDER_SITE_OTHER): Payer: BC Managed Care – PPO

## 2022-12-26 ENCOUNTER — Ambulatory Visit: Payer: BC Managed Care – PPO | Admitting: Sports Medicine

## 2022-12-26 DIAGNOSIS — M5136 Other intervertebral disc degeneration, lumbar region: Secondary | ICD-10-CM

## 2022-12-26 DIAGNOSIS — M5412 Radiculopathy, cervical region: Secondary | ICD-10-CM | POA: Diagnosis not present

## 2022-12-26 DIAGNOSIS — M51369 Other intervertebral disc degeneration, lumbar region without mention of lumbar back pain or lower extremity pain: Secondary | ICD-10-CM | POA: Insufficient documentation

## 2022-12-26 DIAGNOSIS — M545 Low back pain, unspecified: Secondary | ICD-10-CM | POA: Diagnosis not present

## 2022-12-26 DIAGNOSIS — M542 Cervicalgia: Secondary | ICD-10-CM | POA: Diagnosis not present

## 2022-12-26 MED ORDER — PREDNISONE 50 MG PO TABS: ORAL_TABLET | ORAL | 0 refills | Status: DC

## 2022-12-26 MED ORDER — MELOXICAM 15 MG PO TABS: ORAL_TABLET | ORAL | 3 refills | Status: DC

## 2022-12-26 NOTE — Progress Notes (Signed)
    Procedures performed today:    None.  Independent interpretation of notes and tests performed by another provider:   None.  Brief History, Exam, Impression, and Recommendations:    Radiculitis of right cervical region Pleasant 52 year old male, increasing pain in the right periscapular region, radiating down the right upper shoulder, potentially down the elbow but not necessarily to the hand or fingertips. Better with elevation of the right shoulder. Suspect right-sided C6 radiculitis. Adding 5 days of prednisone followed by meloxicam, x-rays, formal physical therapy, return to see me in 6 weeks, MR for interventional planning if no better.  Lumbar degenerative disc disease As above Long history of axial low back pain worse in the morning. No red flag symptoms, nothing radicular. X-rays, PT, prednisone, Mobic, return to see me in 6 weeks, MR for interventional planning if no better.    ____________________________________________ Gwen Her. Dianah Field, M.D., ABFM., CAQSM., AME. Primary Care and Sports Medicine [facility]  Adjunct Professor of [employer]  Risk manager

## 2022-12-26 NOTE — Assessment & Plan Note (Signed)
Pleasant 52 year old male, increasing pain in the right periscapular region, radiating down the right upper shoulder, potentially down the elbow but not necessarily to the hand or fingertips. Better with elevation of the right shoulder. Suspect right-sided C6 radiculitis. Adding 5 days of prednisone followed by meloxicam, x-rays, formal physical therapy, return to see me in 6 weeks, MR for interventional planning if no better.

## 2022-12-26 NOTE — Assessment & Plan Note (Signed)
As above Long history of axial low back pain worse in the morning. No red flag symptoms, nothing radicular. X-rays, PT, prednisone, Mobic, return to see me in 6 weeks, MR for interventional planning if no better.

## 2023-01-09 NOTE — Therapy (Deleted)
OUTPATIENT PHYSICAL THERAPY CERVICAL EVALUATION   Patient Name: Brett Hall MRN: [id number] DOB:Dec 01, 1971, 52 y.o., male Today's Date: 01/09/2023  END OF SESSION:   Past Medical History:  Diagnosis Date   Chest pain, atypical    Femoral hernia    GERD (gastroesophageal reflux disease)    RBBB (right bundle branch block with left anterior fascicular block)    hemblock   Past Surgical History:  Procedure Laterality Date   VARICOCELE EXCISION  2021   Patient Active Problem List   Diagnosis Date Noted   Radiculitis of right cervical region 12/26/2022   Lumbar degenerative disc disease 12/26/2022   Hyperlipidemia LDL goal <70 07/13/2022   Palpitations 07/13/2022   Left lower quadrant abdominal pain 05/04/2021   Left flank pain 05/04/2021   Other microscopic hematuria 05/04/2021   Constipation 05/04/2021   Chronic daily headache 04/20/2020   Varicocele 04/05/2020   Pelvic pain in male 10/29/2019   Elevated CK 08/15/2019   Solitary lung nodule 08/15/2019   IFG (impaired fasting glucose) 05/14/2018   ED (erectile dysfunction) 08/05/2015   Enlarged prostate 04/10/2014   Low back pain 11/22/2012   Gastroesophageal reflux disease 02/22/2011   BUNDLE BRANCH BLOCK, RIGHT HEMIBLOCK 02/06/2011    PCP: Beatrice Lecher MD   REFERRING PROVIDER: Silverio Decamp, MD  REFERRING DIAG: M54.12 (ICD-10-CM) - Radiculitis of right cervical region  THERAPY DIAG:  No diagnosis found.  Rationale for Evaluation and Treatment: Rehabilitation  ONSET DATE: ***  SUBJECTIVE:                                                                                                                                                                                                         SUBJECTIVE STATEMENT: ***  PERTINENT HISTORY:  Lumbar degenerative disc disease As above Long history of axial low back pain worse in the morning. No red flag symptoms, nothing radicular. X-rays, PT,  prednisone, Mobic, return to see me in 6 weeks, MR for interventional planning if no better.    Radiculitis of right cervical region Pleasant 52 year old male, increasing pain in the right periscapular region, radiating down the right upper shoulder, potentially down the elbow but not necessarily to the hand or fingertips. Better with elevation of the right shoulder. Suspect right-sided C6 radiculitis. Adding 5 days of prednisone followed by meloxicam, x-rays, formal physical therapy, return to see me in 6 weeks, MR for interventional planning if no better. PAIN:  Are you having pain? Yes: NPRS scale: ***/10 Pain location: *** Pain description: *** Aggravating factors: *** Relieving factors: ***  PRECAUTIONS: {  Therapy precautions:24002}  WEIGHT BEARING RESTRICTIONS: {Yes ***/No:24003}  FALLS:  Has patient fallen in last 6 months? {fallsyesno:27318}  LIVING ENVIRONMENT: Lives with: {[facility] lives with:25569::"lives with their family"} Lives in: {Lives in:25570} Stairs: {opstairs:27293} Has following equipment at home: {Assistive devices:23999}  OCCUPATION: ***  PLOF: {PLOF:24004}  PATIENT GOALS: ***  NEXT MD VISIT: ***  OBJECTIVE:   DIAGNOSTIC FINDINGS:  ***  PATIENT SURVEYS:  {rehab surveys:24030}  COGNITION: Overall cognitive status: {cognition:24006}  SENSATION: {sensation:27233}  POSTURE: {posture:25561}  PALPATION: ***   CERVICAL ROM:   {AROM/PROM:27142} ROM A/PROM (deg) eval  Flexion   Extension   Right lateral flexion   Left lateral flexion   Right rotation   Left rotation    (Blank rows = not tested)  UPPER EXTREMITY ROM:  {AROM/PROM:27142} ROM Right eval Left eval  Shoulder flexion    Shoulder extension    Shoulder abduction    Shoulder adduction    Shoulder extension    Shoulder internal rotation    Shoulder external rotation    Elbow flexion    Elbow extension    Wrist flexion    Wrist extension    Wrist ulnar deviation     Wrist radial deviation    Wrist pronation    Wrist supination     (Blank rows = not tested)  UPPER EXTREMITY MMT:  MMT Right eval Left eval  Shoulder flexion    Shoulder extension    Shoulder abduction    Shoulder adduction    Shoulder extension    Shoulder internal rotation    Shoulder external rotation    Middle trapezius    Lower trapezius    Elbow flexion    Elbow extension    Wrist flexion    Wrist extension    Wrist ulnar deviation    Wrist radial deviation    Wrist pronation    Wrist supination    Grip strength     (Blank rows = not tested)  CERVICAL SPECIAL TESTS:  {Cervical special tests:25246}  FUNCTIONAL TESTS:  {Functional tests:24029}  TODAY'S TREATMENT:                                                                                                                              DATE: ***   PATIENT EDUCATION:  Education details: *** Person educated: {Person educated:25204} Education method: {Education Method:25205} Education comprehension: {Education Comprehension:25206}  HOME EXERCISE PROGRAM: ***  ASSESSMENT:  CLINICAL IMPRESSION: Patient is a *** y.o. *** who was seen today for physical therapy evaluation and treatment for ***.   OBJECTIVE IMPAIRMENTS: {opptimpairments:25111}.   ACTIVITY LIMITATIONS: {activitylimitations:27494}  PARTICIPATION LIMITATIONS: {participationrestrictions:25113}  PERSONAL FACTORS: {Personal factors:25162} are also affecting patient's functional outcome.   REHAB POTENTIAL: {rehabpotential:25112}  CLINICAL DECISION MAKING: {clinical decision making:25114}  EVALUATION COMPLEXITY: {Evaluation complexity:25115}   GOALS: Goals reviewed with patient? {yes/no:20286}  SHORT TERM GOALS: Target date: ***  *** Baseline: *** Goal status: {GOALSTATUS:25110}  2.  *** Baseline: ***  Goal status: {GOALSTATUS:25110}  3.  *** Baseline: *** Goal status: {GOALSTATUS:25110}  4.  *** Baseline: *** Goal status:  {GOALSTATUS:25110}  5.  *** Baseline: *** Goal status: {GOALSTATUS:25110}  6.  *** Baseline: *** Goal status: {GOALSTATUS:25110}  LONG TERM GOALS: Target date: ***  *** Baseline: *** Goal status: {GOALSTATUS:25110}  2.  *** Baseline: *** Goal status: {GOALSTATUS:25110}  3.  *** Baseline: *** Goal status: {GOALSTATUS:25110}  4.  *** Baseline: *** Goal status: {GOALSTATUS:25110}  5.  *** Baseline: *** Goal status: {GOALSTATUS:25110}  6.  *** Baseline: *** Goal status: {GOALSTATUS:25110}   PLAN:  PT FREQUENCY: {rehab frequency:25116}  PT DURATION: {rehab duration:25117}  PLANNED INTERVENTIONS: {rehab planned interventions:25118::"Therapeutic exercises","Therapeutic activity","Neuromuscular re-education","Balance training","Gait training","Patient/Family education","Self Care","Joint mobilization"}  PLAN FOR NEXT SESSION: ***   Montasia Chisenhall, PT 01/09/2023, 10:52 AM

## 2023-01-10 ENCOUNTER — Ambulatory Visit: Payer: BC Managed Care – PPO | Admitting: Physical Therapy

## 2023-01-17 NOTE — Therapy (Unsigned)
OUTPATIENT PHYSICAL THERAPY THORACOLUMBAR EVALUATION   Patient Name: Brett Hall MRN: [id number] DOB:[birth year], 52 y.o., male Today's Date: 01/18/2023  END OF SESSION:  PT End of Session - 01/18/23 0813     Visit Number 1    Number of Visits 12    Date for PT Re-Evaluation 03/01/23    Authorization Type [employer]    PT Start Time 0807    PT Stop Time 0845    PT Time Calculation (min) 38 min    Activity Tolerance Patient tolerated treatment well    Behavior During Therapy [facility] for tasks assessed/performed             Past Medical History:  Diagnosis Date   Chest pain, atypical    Femoral hernia    GERD (gastroesophageal reflux disease)    RBBB (right bundle branch block with left anterior fascicular block)    hemblock   Past Surgical History:  Procedure Laterality Date   VARICOCELE EXCISION  2021   Patient Active Problem List   Diagnosis Date Noted   Radiculitis of right cervical region 12/26/2022   Lumbar degenerative disc disease 12/26/2022   Hyperlipidemia LDL goal <70 07/13/2022   Palpitations 07/13/2022   Left lower quadrant abdominal pain 05/04/2021   Left flank pain 05/04/2021   Other microscopic hematuria 05/04/2021   Constipation 05/04/2021   Chronic daily headache 04/20/2020   Varicocele 04/05/2020   Pelvic pain in male 10/29/2019   Elevated CK 08/15/2019   Solitary lung nodule 08/15/2019   IFG (impaired fasting glucose) 05/14/2018   ED (erectile dysfunction) 08/05/2015   Enlarged prostate 04/10/2014   Low back pain 11/22/2012   Gastroesophageal reflux disease 02/22/2011   BUNDLE BRANCH BLOCK, RIGHT HEMIBLOCK 02/06/2011    PCP: Beatrice Lecher MD   REFERRING PROVIDER: Silverio Decamp, MD  REFERRING DIAG:  Diagnosis  M54.12 (ICD-10-CM) - Radiculitis of right cervical region   Rationale for Evaluation and Treatment: Rehabilitation  THERAPY DIAG:  Radiculitis of right cervical region  Other low back pain  ONSET DATE: 4-5 mos    SUBJECTIVE:                                                                                                                                                                                           SUBJECTIVE STATEMENT: Patient presents with complaint of right-sided upper extremity pain as well and centralized low back pain.  He initially felt the pain in his right shoulder and arm beginning about 4 months ago.  He has modified his workout routine.  He does not normally feel pain during the course of the day or when exercising,  he does feel it in the morning specially when waking up after sleeping on his back and then stretching his arm over his head.  The pain initially was intermittent but has become more consistent lately.  He has only minimal neck pain and denies weakness shooting pain or sensory symptoms.  The pain in his lower back has been chronic for about a year.  He does not obtain when exercising, sitting, standing or walking.  Mostly he has some pain in the morning that can get a little bit better as he moves around.  He denies weakness shooting pain or sensory disturbance.  Like to make sure these issues do not progress and understand what he can do in the gym to maintain his health and mobility.  He currently works out with weight machines and dumbbells and treadmill.Marland Kitchen     PERTINENT HISTORY:  See above  PAIN:  Are you having pain? Yes: NPRS scale: 2/10 Pain location: Rt outer arm  Pain description: sore, achy , throbby Aggravating factors: sleep, laying down  Relieving factors: moving  None presents   PRECAUTIONS: None  WEIGHT BEARING RESTRICTIONS: No  FALLS:  Has patient fallen in last 6 months? No  LIVING ENVIRONMENT: Lives with: lives with their family Lives in: House/apartment Stairs:  NA Has following equipment at home: None  OCCUPATION: Works as Engineer, site  PLOF: Independent  PATIENT GOALS: Pt would like to deal with the pain and have a solution for  his issues,   NEXT MD VISIT: NT  OBJECTIVE:   DIAGNOSTIC FINDINGS:  Mild degenerative changes at C5-6 which results in mild LEFT-sided osseous neuroforaminal narrowing.  Mild degenerative changes of the lower lumbar spine.  PATIENT SURVEYS:  FOTO 63%  SCREENING FOR RED FLAGS: None  COGNITION: Overall cognitive status: Within functional limits for tasks assessed     SENSATION: WFL  MUSCLE LENGTH: Hamstrings: tight  Thomas test: tight   POSTURE: rounded shoulders, forward head, and anterior pelvic tilt  PALPATION: Generalized TTP along right upper trap, and anterior deltoid.  No unusual pain when cervical spine palpated mild stiffness in lateral flexion. Pain lumbar spine centrally with palpation along L4 L5-S1, mild stiffness   CERVICAL ROM:   Active ROM A/PROM (deg) eval  Flexion 64, cap 34  Extension 50   Right lateral flexion 32  Left lateral flexion 30  Right rotation WFL  Left rotation WFL   (Blank rows = not tested)  LUMBAR ROM:   AROM eval  Flexion WNL  Extension WNL   Right lateral flexion WNL  Left lateral flexion WNL   Right rotation WNL  Left rotation WNL    (Blank rows = not tested)  LOWER EXTREMITY ROM:     Active  Right eval Left eval  Hip flexion  WNL  Hip extension    Hip abduction    Hip adduction    Hip internal rotation    Hip external rotation  Tight   Knee flexion  WNL  Knee extension  WNL   Ankle dorsiflexion    Ankle plantarflexion    Ankle inversion    Ankle eversion     (Blank rows = not tested)  LOWER EXTREMITY MMT:    MMT Right eval Left eval  Hip flexion 5/5 5/5  Hip extension    Hip abduction    Hip adduction    Hip internal rotation    Hip external rotation    Knee flexion 5/5 5/5  Knee extension 5/5 5/5  Ankle dorsiflexion 5/5 5/5  Ankle plantarflexion    Ankle inversion    Ankle eversion     (Blank rows = not tested)   LUMBAR SPECIAL TESTS:  Prone instability test: Negative, Straight leg  raise test: Negative, and Slump test: Negative Neg Spurling cervical spine   FUNCTIONAL TESTS:  NT on eval   GAIT: Distance walked: 150 Assistive device utilized: None Level of assistance: Complete Independence Comments: none  TODAY'S TREATMENT:                                                                                                                              DATE: 01/18/23    PATIENT EDUCATION:  Education details: HEP, mobility, core  Person educated: Patient Education method: Customer service manager Education comprehension: verbalized understanding, returned demonstration, and needs further education  HOME EXERCISE PROGRAM: Access Code: [email or web address] URL: [email or web address] Date: 01/18/2023 Prepared by: Raeford Razor  Exercises - Corner Pec Major Stretch  - 1 x daily - 7 x weekly - 1 sets - 5 reps - 30 hold - Supine Lower Trunk Rotation  - 1 x daily - 7 x weekly - 2 sets - 10 reps - 10 hold - Cat Cow to Child's Pose  - 1 x daily - 7 x weekly - 1 sets - 10 reps - 10-20 hold - Hip Flexor Stretch at Edge of Bed  - 1 x daily - 7 x weekly - 1 sets - 3 reps - 30 hold  ASSESSMENT:  CLINICAL IMPRESSION: Patient is a 52 y.o. male who was seen today for physical therapy evaluation and treatment for cervical and lumbar pain. Will focus on developing a mobility routine for hips and shoulders, lower ab stability and for with weight training.   OBJECTIVE IMPAIRMENTS: decreased mobility, difficulty walking, decreased ROM, decreased strength, increased fascial restrictions, impaired flexibility, improper body mechanics, postural dysfunction, pain, and   .   ACTIVITY LIMITATIONS: carrying, lifting, bending, squatting, and sleeping  PARTICIPATION LIMITATIONS: community activity  PERSONAL FACTORS: Time since onset of injury/illness/exacerbation are also affecting patient's functional outcome.   REHAB POTENTIAL: Excellent  CLINICAL DECISION MAKING:  Stable/uncomplicated  EVALUATION COMPLEXITY: Low   GOALS:  LONG TERM GOALS: Target date: 03/01/2023    Patient will be independent with home exercise routine and lifting principles for the gym Baseline:  Goal status: INITIAL  2.  Patient will be able to carry his daughter without increased lower back pain Baseline:  Goal status: INITIAL  3.  Patient will report UE/cervical pain in AM reduced to < 2/10 no more than 2 days/week Baseline:  Goal status: INITIAL  4.  Patient will be able to perform lower ab exercises in supine without increasing lumbar strain Baseline: pain with 90/90 toe taps  Goal status: INITIAL  5.  Patient will show proper body mechanics with lifting, squatting, dead lifting to preserve spine integrity Baseline:  Goal status: INITIAL  6.  FOTO score will improve to  87% or greater to demonstrate improved functional independence Baseline:  Goal status: INITIAL  PLAN:  PT FREQUENCY: 1x/week  PT DURATION: 6 weeks  PLANNED INTERVENTIONS: Therapeutic exercises, Therapeutic activity, Neuromuscular re-education, Balance training, Gait training, Patient/Family education, Self Care, Joint mobilization, Dry Needling, Cryotherapy, Moist heat, Manual therapy, and Re-evaluation.  PLAN FOR NEXT SESSION: Check home exercise program lower ab/back stabilization begin lifting and hinging   Stanton Kissoon, PT 01/18/2023, 12:19 PM

## 2023-01-18 ENCOUNTER — Encounter: Payer: Self-pay | Admitting: Physical Therapy

## 2023-01-18 ENCOUNTER — Ambulatory Visit: Payer: BC Managed Care – PPO | Attending: Sports Medicine | Admitting: Physical Therapy

## 2023-01-18 DIAGNOSIS — M5459 Other low back pain: Secondary | ICD-10-CM | POA: Insufficient documentation

## 2023-01-18 DIAGNOSIS — M5412 Radiculopathy, cervical region: Secondary | ICD-10-CM | POA: Insufficient documentation

## 2023-01-19 ENCOUNTER — Encounter: Payer: Self-pay | Admitting: Family Medicine

## 2023-01-19 ENCOUNTER — Ambulatory Visit: Payer: BC Managed Care – PPO | Admitting: Family Medicine

## 2023-01-19 VITALS — BP 128/73 | HR 76 | Ht 71.0 in | Wt 200.0 lb

## 2023-01-19 DIAGNOSIS — R1032 Left lower quadrant pain: Secondary | ICD-10-CM | POA: Diagnosis not present

## 2023-01-19 NOTE — Progress Notes (Signed)
   Acute Office Visit  Subjective:     Patient ID: Brett Hall, male    DOB: October 10, 1971, 52 y.o.   MRN: [id number]  Chief Complaint  Patient presents with   Abdominal Pain    LLQ pain last episode was 2 weeks ago. He reports that the pain comes and goes and its sharp.    HPI Patient is in today for intermittent abdominal pain.  When I last saw him in August he had complained of 2 episodes of very brief left lower quadrant pain we did a KUB and some labs at that time and all were reassuring. Reports bowel are very regular. No blood in stool.  Says it is a grabbing pain.    ROS      Objective:    BP 128/73   Pulse 76   Ht 5' 11"$  (1.803 m)   Wt 200 lb (90.7 kg)   SpO2 97%   BMI 27.89 kg/m    Physical Exam Constitutional:      Appearance: He is well-developed.  HENT:     Head: Normocephalic and atraumatic.  Cardiovascular:     Rate and Rhythm: Normal rate and regular rhythm.     Heart sounds: Normal heart sounds.  Pulmonary:     Effort: Pulmonary effort is normal.     Breath sounds: Normal breath sounds.  Abdominal:     General: Bowel sounds are normal. There is no distension.     Palpations: Abdomen is soft. There is no mass.     Tenderness: There is no abdominal tenderness.     Hernia: No hernia is present.  Skin:    General: Skin is warm and dry.  Neurological:     Mental Status: He is alert and oriented to person, place, and time.  Psychiatric:        Behavior: Behavior normal.     No results found for any visits on 01/19/23.      Assessment & Plan:   Problem List Items Addressed This Visit       Other   Left lower quadrant abdominal pain - Primary   Relevant Orders   CT Abdomen Pelvis W Contrast  LLQ pain - unclear etiology.  Paulita Cradle to work out at [employer] and [employer] with no discomfort.  No bulging to indicate a hernia.  He feels bowels are moving normally.  Colonoscopy is up to date.  Will schedule for CT since has been going on for 6  months.    No orders of the defined types were placed in this encounter.   No follow-ups on file.  Beatrice Lecher, MD

## 2023-01-22 ENCOUNTER — Ambulatory Visit (INDEPENDENT_AMBULATORY_CARE_PROVIDER_SITE_OTHER): Payer: BC Managed Care – PPO

## 2023-01-22 DIAGNOSIS — R1032 Left lower quadrant pain: Secondary | ICD-10-CM

## 2023-01-22 MED ORDER — IOHEXOL 300 MG/ML  SOLN
100.0000 mL | Freq: Once | INTRAMUSCULAR | Status: AC | PRN
  Administered 2023-01-22: 100 mL via INTRAVENOUS

## 2023-01-24 NOTE — Progress Notes (Signed)
Hi Brett Hall, the scan looks completely normal no concerning findings in the abdomen or pelvis.  You do have a little bit of arthritis in your back at L4-5 and L5-S1.  You said it was not stopping you from exercising at the gym.  I think if it continues I like for you to maybe get in and see Dr. Darene Lamer just to make sure that he does not think it is more musculoskeletal.  Because otherwise I do not have a good explanation for why you are having the intermittent discomfort.

## 2023-01-25 NOTE — Therapy (Deleted)
OUTPATIENT PHYSICAL THERAPY TREATMENT NOTE   Patient Name: Brett Hall MRN: [id number] DOB:1971/08/01, 52 y.o., male Today's Date: 01/25/2023  PCP: *** REFERRING PROVIDER: ***  END OF SESSION:    Past Medical History:  Diagnosis Date   Chest pain, atypical    Femoral hernia    GERD (gastroesophageal reflux disease)    RBBB (right bundle branch block with left anterior fascicular block)    hemblock   Past Surgical History:  Procedure Laterality Date   VARICOCELE EXCISION  2021   Patient Active Problem List   Diagnosis Date Noted   Radiculitis of right cervical region 12/26/2022   Lumbar degenerative disc disease 12/26/2022   Hyperlipidemia LDL goal <70 07/13/2022   Palpitations 07/13/2022   Left lower quadrant abdominal pain 05/04/2021   Left flank pain 05/04/2021   Other microscopic hematuria 05/04/2021   Constipation 05/04/2021   Chronic daily headache 04/20/2020   Varicocele 04/05/2020   Pelvic pain in male 10/29/2019   Elevated CK 08/15/2019   Solitary lung nodule 08/15/2019   IFG (impaired fasting glucose) 05/14/2018   ED (erectile dysfunction) 08/05/2015   Enlarged prostate 04/10/2014   Low back pain 11/22/2012   Gastroesophageal reflux disease 02/22/2011   BUNDLE BRANCH BLOCK, RIGHT HEMIBLOCK 02/06/2011    REFERRING DIAG: ***  THERAPY DIAG:  No diagnosis found.  Rationale for Evaluation and Treatment {HABREHAB:[id number]}  PERTINENT HISTORY: ***  PRECAUTIONS: ***  SUBJECTIVE:                                                                                                                                                                                      SUBJECTIVE STATEMENT:  ***   PAIN:  Are you having pain? {OPRCPAIN:[id number]}   OBJECTIVE: (objective measures completed at initial evaluation unless otherwise dated) DIAGNOSTIC FINDINGS:  Mild degenerative changes at C5-6 which results in mild LEFT-sided osseous neuroforaminal narrowing.    Mild degenerative changes of the lower lumbar spine.   PATIENT SURVEYS:  FOTO 63%   SCREENING FOR RED FLAGS: None   COGNITION: Overall cognitive status: Within functional limits for tasks assessed                          SENSATION: WFL   MUSCLE LENGTH: Hamstrings: tight  Thomas test: tight    POSTURE: rounded shoulders, forward head, and anterior pelvic tilt   PALPATION: Generalized TTP along right upper trap, and anterior deltoid.  No unusual pain when cervical spine palpated mild stiffness in lateral flexion. Pain lumbar spine centrally with palpation along L4 L5-S1, mild stiffness    CERVICAL ROM:    Active ROM A/PROM (deg)  eval  Flexion 64, cap 34  Extension 50   Right lateral flexion 32  Left lateral flexion 30  Right rotation WFL  Left rotation WFL   (Blank rows = not tested)  LUMBAR ROM:    AROM eval  Flexion WNL  Extension WNL   Right lateral flexion WNL  Left lateral flexion WNL   Right rotation WNL  Left rotation WNL    (Blank rows = not tested)   LOWER EXTREMITY ROM:      Active  Right eval Left eval  Hip flexion   WNL  Hip extension      Hip abduction      Hip adduction      Hip internal rotation      Hip external rotation   Tight   Knee flexion   WNL  Knee extension   WNL   Ankle dorsiflexion      Ankle plantarflexion      Ankle inversion      Ankle eversion       (Blank rows = not tested)   LOWER EXTREMITY MMT:     MMT Right eval Left eval  Hip flexion 5/5 5/5  Hip extension      Hip abduction      Hip adduction      Hip internal rotation      Hip external rotation      Knee flexion 5/5 5/5  Knee extension 5/5 5/5  Ankle dorsiflexion 5/5 5/5  Ankle plantarflexion      Ankle inversion      Ankle eversion       (Blank rows = not tested)     LUMBAR SPECIAL TESTS:  Prone instability test: Negative, Straight leg raise test: Negative, and Slump test: Negative Neg Spurling cervical spine    FUNCTIONAL TESTS:  NT on  eval    GAIT: Distance walked: 150 Assistive device utilized: None Level of assistance: Complete Independence Comments: none   TODAY'S TREATMENT:                                                                                                                              DATE: 01/18/23      PATIENT EDUCATION:  Education details: HEP, mobility, core  Person educated: Patient Education method: Customer service manager Education comprehension: verbalized understanding, returned demonstration, and needs further education   HOME EXERCISE PROGRAM: Access Code: [email or web address] URL: [email or web address] Date: 01/18/2023 Prepared by: Raeford Razor   Exercises - Corner Pec Major Stretch  - 1 x daily - 7 x weekly - 1 sets - 5 reps - 30 hold - Supine Lower Trunk Rotation  - 1 x daily - 7 x weekly - 2 sets - 10 reps - 10 hold - Cat Cow to Child's Pose  - 1 x daily - 7 x weekly - 1 sets - 10 reps - 10-20 hold - Hip Flexor Stretch at Edge of Bed  - 1 x  daily - 7 x weekly - 1 sets - 3 reps - 30 hold   ASSESSMENT:   CLINICAL IMPRESSION: Patient is a 52 y.o. male who was seen today for physical therapy evaluation and treatment for cervical and lumbar pain. Will focus on developing a mobility routine for hips and shoulders, lower ab stability and for with weight training.    OBJECTIVE IMPAIRMENTS: decreased mobility, difficulty walking, decreased ROM, decreased strength, increased fascial restrictions, impaired flexibility, improper body mechanics, postural dysfunction, pain, and   .    ACTIVITY LIMITATIONS: carrying, lifting, bending, squatting, and sleeping   PARTICIPATION LIMITATIONS: community activity   PERSONAL FACTORS: Time since onset of injury/illness/exacerbation are also affecting patient's functional outcome.    REHAB POTENTIAL: Excellent   CLINICAL DECISION MAKING: Stable/uncomplicated   EVALUATION COMPLEXITY: Low     GOALS:   LONG TERM GOALS: Target date:  03/01/2023     Patient will be independent with home exercise routine and lifting principles for the gym Baseline:  Goal status: INITIAL   2.  Patient will be able to carry his daughter without increased lower back pain Baseline:  Goal status: INITIAL   3.  Patient will report UE/cervical pain in AM reduced to < 2/10 no more than 2 days/week Baseline:  Goal status: INITIAL   4.  Patient will be able to perform lower ab exercises in supine without increasing lumbar strain Baseline: pain with 90/90 toe taps  Goal status: INITIAL   5.  Patient will show proper body mechanics with lifting, squatting, dead lifting to preserve spine integrity Baseline:  Goal status: INITIAL   6.  FOTO score will improve to 87% or greater to demonstrate improved functional independence Baseline:  Goal status: INITIAL   PLAN:   PT FREQUENCY: 1x/week   PT DURATION: 6 weeks   PLANNED INTERVENTIONS: Therapeutic exercises, Therapeutic activity, Neuromuscular re-education, Balance training, Gait training, Patient/Family education, Self Care, Joint mobilization, Dry Needling, Cryotherapy, Moist heat, Manual therapy, and Re-evaluation.   PLAN FOR NEXT SESSION: Check home exercise program lower ab/back stabilization begin lifting and hinging    (Copy Eval's Objective through Plan section here)   Hakiem Malizia, PT 01/25/2023, 11:01 AM

## 2023-01-26 ENCOUNTER — Ambulatory Visit: Payer: BC Managed Care – PPO | Admitting: Physical Therapy

## 2023-01-26 ENCOUNTER — Telehealth: Payer: Self-pay | Admitting: Physical Therapy

## 2023-01-26 NOTE — Telephone Encounter (Signed)
Called patient regarding his missed appt today.  Reinforced the attendance policy and asked that he call us if he is unable to make his next appt or if he needs a change of appt time.    Raeford Razor, PT 01/26/23 8:29 AM Phone: [phone] Fax: [phone]

## 2023-02-02 ENCOUNTER — Ambulatory Visit: Payer: BC Managed Care – PPO | Attending: Sports Medicine | Admitting: Physical Therapy

## 2023-02-02 DIAGNOSIS — M5412 Radiculopathy, cervical region: Secondary | ICD-10-CM | POA: Diagnosis not present

## 2023-02-02 DIAGNOSIS — M5459 Other low back pain: Secondary | ICD-10-CM | POA: Diagnosis present

## 2023-02-02 NOTE — Therapy (Signed)
OUTPATIENT PHYSICAL THERAPY TREATMENT NOTE   Patient Name: Brett Hall MRN: [id number] DOB:Jan 11, 1971, 52 y.o., male Today's Date: 02/02/2023  PCP: Beatrice Lecher MD  REFERRING PROVIDER: Dr.Thekkekandam, Gwen Her, MD   END OF SESSION:   PT End of Session - 02/02/23 0814     Visit Number 2    Number of Visits 12    Date for PT Re-Evaluation 03/01/23    Authorization Type BCBS    PT Start Time 0815   13 min late   PT Stop Time [id number]    PT Time Calculation (min) 32 min    Activity Tolerance Patient tolerated treatment well    Behavior During Therapy [facility] for tasks assessed/performed             Past Medical History:  Diagnosis Date   Chest pain, atypical    Femoral hernia    GERD (gastroesophageal reflux disease)    RBBB (right bundle branch block with left anterior fascicular block)    hemblock   Past Surgical History:  Procedure Laterality Date   VARICOCELE EXCISION  2021   Patient Active Problem List   Diagnosis Date Noted   Radiculitis of right cervical region 12/26/2022   Lumbar degenerative disc disease 12/26/2022   Hyperlipidemia LDL goal <70 07/13/2022   Palpitations 07/13/2022   Left lower quadrant abdominal pain 05/04/2021   Left flank pain 05/04/2021   Other microscopic hematuria 05/04/2021   Constipation 05/04/2021   Chronic daily headache 04/20/2020   Varicocele 04/05/2020   Pelvic pain in male 10/29/2019   Elevated CK 08/15/2019   Solitary lung nodule 08/15/2019   IFG (impaired fasting glucose) 05/14/2018   ED (erectile dysfunction) 08/05/2015   Enlarged prostate 04/10/2014   Low back pain 11/22/2012   Gastroesophageal reflux disease 02/22/2011   BUNDLE BRANCH BLOCK, RIGHT HEMIBLOCK 02/06/2011    REFERRING DIAG:  Diagnosis  M54.12 (ICD-10-CM) - Radiculitis of right cervical region    THERAPY DIAG:  Radiculitis of right cervical region  Other low back pain  Rationale for Evaluation and Treatment Rehabilitation  PERTINENT  HISTORY: see above   PRECAUTIONS: none   SUBJECTIVE:                                                                                                                                                                                      SUBJECTIVE STATEMENT:  I was out of town last time.  I was having some abdominal pain and it was OK.  I went back to the gym.  Have some pain in my low back in the AM.   m  PAIN:  Are you having pain? Yes: NPRS scale: none /  10 Pain location: neck  Pain description: achy, sore  Aggravating factors: not sure Relieving factors: NA    OBJECTIVE: (objective measures completed at initial evaluation unless otherwise dated)  DIAGNOSTIC FINDINGS:  Mild degenerative changes at C5-6 which results in mild LEFT-sided osseous neuroforaminal narrowing.   Mild degenerative changes of the lower lumbar spine.   PATIENT SURVEYS:  FOTO 63%   SCREENING FOR RED FLAGS: None   COGNITION: Overall cognitive status: Within functional limits for tasks assessed                          SENSATION: WFL   MUSCLE LENGTH: Hamstrings: tight  Thomas test: tight    POSTURE: rounded shoulders, forward head, and anterior pelvic tilt   PALPATION: Generalized TTP along right upper trap, and anterior deltoid.  No unusual pain when cervical spine palpated mild stiffness in lateral flexion. Pain lumbar spine centrally with palpation along L4 L5-S1, mild stiffness    CERVICAL ROM:    Active ROM A/PROM (deg) eval  Flexion 64, cap 34  Extension 50   Right lateral flexion 32  Left lateral flexion 30  Right rotation WFL  Left rotation WFL   (Blank rows = not tested)  LUMBAR ROM:    AROM eval  Flexion WNL  Extension WNL   Right lateral flexion WNL  Left lateral flexion WNL   Right rotation WNL  Left rotation WNL    (Blank rows = not tested)   LOWER EXTREMITY ROM:      Active  Right eval Left eval  Hip flexion   WNL  Hip extension      Hip abduction      Hip  adduction      Hip internal rotation      Hip external rotation   Tight   Knee flexion   WNL  Knee extension   WNL   Ankle dorsiflexion      Ankle plantarflexion      Ankle inversion      Ankle eversion       (Blank rows = not tested)   LOWER EXTREMITY MMT:     MMT Right eval Left eval  Hip flexion 5/5 5/5  Hip extension      Hip abduction      Hip adduction      Hip internal rotation      Hip external rotation      Knee flexion 5/5 5/5  Knee extension 5/5 5/5  Ankle dorsiflexion 5/5 5/5  Ankle plantarflexion      Ankle inversion      Ankle eversion       (Blank rows = not tested)     LUMBAR SPECIAL TESTS:  Prone instability test: Negative, Straight leg raise test: Negative, and Slump test: Negative Neg Spurling cervical spine    FUNCTIONAL TESTS:  NT on eval    GAIT: Distance walked: 150 Assistive device utilized: None Level of assistance: Complete Independence Comments: none   TODAY'S TREATMENT:  DATE: 01/18/23    [facility] Adult PT Treatment:                                                DATE: 02/02/23 Therapeutic Exercise: UBE level 3 in reverse for 5 min  Corner Pec Major Stretch 2 x 30 sec  Cat Cow to Child's Pose x 5 Thoracic mobility (thread the needle)  Lower trunk rotation with head turns  Blue band x 10 for horizontal abd and diagonal pull supine then standing at wall  Row 2 sets x 10, 45 lbs  Lat pull down -next visit  Self Care: Neck position as it relates to lifting and avoiding jutting chin forward in order to gain ROM in shoulder, neutral spine   PATIENT EDUCATION:  Education details: HEP, mobility, core  Person educated: Patient Education method: Customer service manager Education comprehension: verbalized understanding, returned demonstration, and needs further education   HOME EXERCISE PROGRAM: Access Code:  [email or web address] URL: [email or web address] Date: 01/18/2023 Prepared by: Raeford Razor     ASSESSMENT:   CLINICAL IMPRESSION: Patient arrived late today but was able to get in corrective exercises and discussed proper form with gym Barista.  Fortunately he is having no pain in his neck or upper body at the moment.  He will benefit from a few more physical therapy visits in order to reinforce the concept of proper form with lifting and stabilization.    . OBJECTIVE IMPAIRMENTS: decreased mobility, difficulty walking, decreased ROM, decreased strength, increased fascial restrictions, impaired flexibility, improper body mechanics, postural dysfunction, pain, and   .    ACTIVITY LIMITATIONS: carrying, lifting, bending, squatting, and sleeping   PARTICIPATION LIMITATIONS: community activity   PERSONAL FACTORS: Time since onset of injury/illness/exacerbation are also affecting patient's functional outcome.    REHAB POTENTIAL: Excellent   CLINICAL DECISION MAKING: Stable/uncomplicated   EVALUATION COMPLEXITY: Low     GOALS:   LONG TERM GOALS: Target date: 03/01/2023     Patient will be independent with home exercise routine and lifting principles for the gym Baseline:  Goal status: INITIAL   2.  Patient will be able to carry his daughter without increased lower back pain Baseline:  Goal status: INITIAL   3.  Patient will report UE/cervical pain in AM reduced to < 2/10 no more than 2 days/week Baseline:  Goal status: INITIAL   4.  Patient will be able to perform lower ab exercises in supine without increasing lumbar strain Baseline: pain with 90/90 toe taps  Goal status: INITIAL   5.  Patient will show proper body mechanics with lifting, squatting, dead lifting to preserve spine integrity Baseline:  Goal status: INITIAL   6.  FOTO score will improve to 87% or greater to demonstrate improved functional independence Baseline:  Goal status: INITIAL   PLAN:    PT FREQUENCY: 1x/week   PT DURATION: 6 weeks   PLANNED INTERVENTIONS: Therapeutic exercises, Therapeutic activity, Neuromuscular re-education, Balance training, Gait training, Patient/Family education, Self Care, Joint mobilization, Dry Needling, Cryotherapy, Moist heat, Manual therapy, and Re-evaluation.   PLAN FOR NEXT SESSION: Check home exercise program lower ab/back stabilization begin lifting and hinging   Raeford Razor, PT 02/02/23 9:04 AM Phone: [phone] Fax: [phone]   Brett Hall, PT 02/02/2023, 9:04 AM

## 2023-02-05 ENCOUNTER — Ambulatory Visit: Payer: BC Managed Care – PPO | Admitting: Physical Therapy

## 2023-02-05 ENCOUNTER — Encounter: Payer: Self-pay | Admitting: Physical Therapy

## 2023-02-05 DIAGNOSIS — M5412 Radiculopathy, cervical region: Secondary | ICD-10-CM

## 2023-02-05 DIAGNOSIS — M5459 Other low back pain: Secondary | ICD-10-CM | POA: Diagnosis not present

## 2023-02-05 NOTE — Therapy (Signed)
OUTPATIENT PHYSICAL THERAPY TREATMENT NOTE   Patient Name: Brett Hall MRN: [id number] DOB:1971/05/01, 52 y.o., male Today's Date: 02/05/2023  PCP: Beatrice Lecher MD  REFERRING PROVIDER: Dr.Thekkekandam, Gwen Her, MD   END OF SESSION:   PT End of Session - 02/05/23 0857     Visit Number 3    Number of Visits 12    Date for PT Re-Evaluation 03/01/23    Authorization Type BCBS    PT Start Time [id number]   10 min late   PT Stop Time 0928    PT Time Calculation (min) 33 min             Past Medical History:  Diagnosis Date   Chest pain, atypical    Femoral hernia    GERD (gastroesophageal reflux disease)    RBBB (right bundle branch block with left anterior fascicular block)    hemblock   Past Surgical History:  Procedure Laterality Date   VARICOCELE EXCISION  2021   Patient Active Problem List   Diagnosis Date Noted   Radiculitis of right cervical region 12/26/2022   Lumbar degenerative disc disease 12/26/2022   Hyperlipidemia LDL goal <70 07/13/2022   Palpitations 07/13/2022   Left lower quadrant abdominal pain 05/04/2021   Left flank pain 05/04/2021   Other microscopic hematuria 05/04/2021   Constipation 05/04/2021   Chronic daily headache 04/20/2020   Varicocele 04/05/2020   Pelvic pain in male 10/29/2019   Elevated CK 08/15/2019   Solitary lung nodule 08/15/2019   IFG (impaired fasting glucose) 05/14/2018   ED (erectile dysfunction) 08/05/2015   Enlarged prostate 04/10/2014   Low back pain 11/22/2012   Gastroesophageal reflux disease 02/22/2011   BUNDLE BRANCH BLOCK, RIGHT HEMIBLOCK 02/06/2011    REFERRING DIAG:  Diagnosis  M54.12 (ICD-10-CM) - Radiculitis of right cervical region    THERAPY DIAG:  Radiculitis of right cervical region  Other low back pain  Rationale for Evaluation and Treatment Rehabilitation  PERTINENT HISTORY: see above   PRECAUTIONS: none   SUBJECTIVE:                                                                                                                                                                                       SUBJECTIVE STATEMENT:  No pain in neck or back. I pulled a calf muscle last Friday. I went to the gym Friday after I was here. I got some good tips to use when exercising.    PAIN:  Are you having pain? Yes: NPRS scale: 0/10 Pain location: neck  Pain description: achy, sore  Aggravating factors: not sure Relieving factors: NA    OBJECTIVE: (objective measures completed at  initial evaluation unless otherwise dated)  DIAGNOSTIC FINDINGS:  Mild degenerative changes at C5-6 which results in mild LEFT-sided osseous neuroforaminal narrowing.   Mild degenerative changes of the lower lumbar spine.   PATIENT SURVEYS:  FOTO 63%   SCREENING FOR RED FLAGS: None   COGNITION: Overall cognitive status: Within functional limits for tasks assessed                          SENSATION: WFL   MUSCLE LENGTH: Hamstrings: tight  Thomas test: tight    POSTURE: rounded shoulders, forward head, and anterior pelvic tilt   PALPATION: Generalized TTP along right upper trap, and anterior deltoid.  No unusual pain when cervical spine palpated mild stiffness in lateral flexion. Pain lumbar spine centrally with palpation along L4 L5-S1, mild stiffness    CERVICAL ROM:    Active ROM A/PROM (deg) eval  Flexion 64, cap 34  Extension 50   Right lateral flexion 32  Left lateral flexion 30  Right rotation WFL  Left rotation WFL   (Blank rows = not tested)  LUMBAR ROM:    AROM eval  Flexion WNL  Extension WNL   Right lateral flexion WNL  Left lateral flexion WNL   Right rotation WNL  Left rotation WNL    (Blank rows = not tested)   LOWER EXTREMITY ROM:      Active  Right eval Left eval  Hip flexion   WNL  Hip extension      Hip abduction      Hip adduction      Hip internal rotation      Hip external rotation   Tight   Knee flexion   WNL  Knee extension   WNL    Ankle dorsiflexion      Ankle plantarflexion      Ankle inversion      Ankle eversion       (Blank rows = not tested)   LOWER EXTREMITY MMT:     MMT Right eval Left eval  Hip flexion 5/5 5/5  Hip extension      Hip abduction      Hip adduction      Hip internal rotation      Hip external rotation      Knee flexion 5/5 5/5  Knee extension 5/5 5/5  Ankle dorsiflexion 5/5 5/5  Ankle plantarflexion      Ankle inversion      Ankle eversion       (Blank rows = not tested)     LUMBAR SPECIAL TESTS:  Prone instability test: Negative, Straight leg raise test: Negative, and Slump test: Negative Neg Spurling cervical spine    FUNCTIONAL TESTS:  NT on eval    GAIT: Distance walked: 150 Assistive device utilized: None Level of assistance: Complete Independence Comments: none   TODAY'S TREATMENT:  DATE: 01/18/23    [facility] Adult PT Treatment:                                                DATE: 02/05/23 Therapeutic Exercise: UBE 3 min each way Level 3 Cat camel to childs pose Thread the needle x 2 each  Supine horizontal abduction blue band  Supine alternating diagonals blue band  90/90 ab brace 10 sec , no pain, able to perform alternating toe taps x 10 without LBP Hip flexor stretch bilateral  LTR Figure 4 stretch bilateral Pec stretch in doorway   Therapeutic Activity: Lift 15# x 10 Squat 15# x 10    [facility] Adult PT Treatment:                                                DATE: 02/02/23 Therapeutic Exercise: UBE level 3 in reverse for 5 min  Corner Pec Major Stretch 2 x 30 sec  Cat Cow to Child's Pose x 5 Thoracic mobility (thread the needle)  Lower trunk rotation with head turns  Blue band x 10 for horizontal abd and diagonal pull supine then standing at wall  Row 2 sets x 10, 45 lbs  Lat pull down -next visit  Self Care: Neck position as  it relates to lifting and avoiding jutting chin forward in order to gain ROM in shoulder, neutral spine   PATIENT EDUCATION:  Education details: HEP, mobility, core  Person educated: Patient Education method: Explanation and Demonstration Education comprehension: verbalized understanding, returned demonstration, and needs further education   HOME EXERCISE PROGRAM: Access Code: [email or web address] URL: [email or web address] Date: 01/18/2023 Prepared by: Raeford Razor     ASSESSMENT:   CLINICAL IMPRESSION: Patient arrived late today but was able to begin hinging and lifting with pt return demonstrating correct form with light weight and min cues. He was able to complete supine lowe ab strengthening without increased pain today. His HEP was updated.   He will benefit from a few more physical therapy visits in order to reinforce the concept of proper form with lifting and stabilization.    . OBJECTIVE IMPAIRMENTS: decreased mobility, difficulty walking, decreased ROM, decreased strength, increased fascial restrictions, impaired flexibility, improper body mechanics, postural dysfunction, pain, and   .    ACTIVITY LIMITATIONS: carrying, lifting, bending, squatting, and sleeping   PARTICIPATION LIMITATIONS: community activity   PERSONAL FACTORS: Time since onset of injury/illness/exacerbation are also affecting patient's functional outcome.    REHAB POTENTIAL: Excellent   CLINICAL DECISION MAKING: Stable/uncomplicated   EVALUATION COMPLEXITY: Low     GOALS:   LONG TERM GOALS: Target date: 03/01/2023     Patient will be independent with home exercise routine and lifting principles for the gym Baseline:  Goal status: INITIAL   2.  Patient will be able to carry his daughter without increased lower back pain Baseline:  Goal status: INITIAL   3.  Patient will report UE/cervical pain in AM reduced to < 2/10 no more than 2 days/week Baseline:  Goal status: INITIAL   4.   Patient will be able to perform lower ab exercises in supine without increasing lumbar strain Baseline: pain with 90/90 toe taps  Goal status: INITIAL   5.  Patient will show proper body mechanics with lifting, squatting, dead lifting to preserve spine integrity Baseline:  Goal status: INITIAL   6.  FOTO score will improve to 87% or greater to demonstrate improved functional independence Baseline:  Goal status: INITIAL   PLAN:   PT FREQUENCY: 1x/week   PT DURATION: 6 weeks   PLANNED INTERVENTIONS: Therapeutic exercises, Therapeutic activity, Neuromuscular re-education, Balance training, Gait training, Patient/Family education, Self Care, Joint mobilization, Dry Needling, Cryotherapy, Moist heat, Manual therapy, and Re-evaluation.   PLAN FOR NEXT SESSION: Check home exercise program lower ab/back stabilization begin lifting and hinging   Hessie Diener, PTA 02/05/23 1:20 PM Phone: [phone] Fax: [phone]

## 2023-02-06 ENCOUNTER — Ambulatory Visit: Payer: BC Managed Care – PPO | Admitting: Sports Medicine

## 2023-02-09 NOTE — Therapy (Unsigned)
OUTPATIENT PHYSICAL THERAPY TREATMENT NOTE DISCHARGE   Patient Name: Brett Hall MRN: [id number] DOB:01/06/1971, 52 y.o., male Today's Date: 02/12/2023  PCP: Beatrice Lecher MD  REFERRING PROVIDER: Dr.Thekkekandam, Gwen Her, MD   END OF SESSION:   PT End of Session - 02/12/23 0853     Visit Number 4    Number of Visits 12    Date for PT Re-Evaluation 03/01/23    Authorization Type BC[employer]    PT Start Time 0852    PT Stop Time 0934    PT Time Calculation (min) 42 min    Activity Tolerance Patient tolerated treatment well    Behavior During Therapy [facility] for tasks assessed/performed              Past Medical History:  Diagnosis Date   Chest pain, atypical    Femoral hernia    GERD (gastroesophageal reflux disease)    RBBB (right bundle branch block with left anterior fascicular block)    hemblock   Past Surgical History:  Procedure Laterality Date   VARICOCELE EXCISION  2021   Patient Active Problem List   Diagnosis Date Noted   Radiculitis of right cervical region 12/26/2022   Lumbar degenerative disc disease 12/26/2022   Hyperlipidemia LDL goal <70 07/13/2022   Palpitations 07/13/2022   Left lower quadrant abdominal pain 05/04/2021   Left flank pain 05/04/2021   Other microscopic hematuria 05/04/2021   Constipation 05/04/2021   Chronic daily headache 04/20/2020   Varicocele 04/05/2020   Pelvic pain in male 10/29/2019   Elevated CK 08/15/2019   Solitary lung nodule 08/15/2019   IFG (impaired fasting glucose) 05/14/2018   ED (erectile dysfunction) 08/05/2015   Enlarged prostate 04/10/2014   Low back pain 11/22/2012   Gastroesophageal reflux disease 02/22/2011   BUNDLE BRANCH BLOCK, RIGHT HEMIBLOCK 02/06/2011    REFERRING DIAG:  Diagnosis  M54.12 (ICD-10-CM) - Radiculitis of right cervical region    THERAPY DIAG:  Radiculitis of right cervical region  Other low back pain  Rationale for Evaluation and Treatment Rehabilitation  PERTINENT  HISTORY: see above   PRECAUTIONS: none   SUBJECTIVE:                                                                                                                                                                                      SUBJECTIVE STATEMENT: Rt shoulder slight pain  otherwise I am good   PAIN:  Are you having pain? Yes: NPRS scale: 1/10 Pain location: Shoulder Pain description: achy, sore  Aggravating factors: not sure Relieving factors: NA    OBJECTIVE: (objective measures completed at initial evaluation unless otherwise dated)  DIAGNOSTIC FINDINGS:  Mild degenerative changes at C5-6 which results in mild LEFT-sided osseous neuroforaminal narrowing.   Mild degenerative changes of the lower lumbar spine.   PATIENT SURVEYS:  FOTO 63%   SCREENING FOR RED FLAGS: None   COGNITION: Overall cognitive status: Within functional limits for tasks assessed                          SENSATION: WFL   MUSCLE LENGTH: Hamstrings: tight  Thomas test: tight    POSTURE: rounded shoulders, forward head, and anterior pelvic tilt   PALPATION: Generalized TTP along right upper trap, and anterior deltoid.  No unusual pain when cervical spine palpated mild stiffness in lateral flexion. Pain lumbar spine centrally with palpation along L4 L5-S1, mild stiffness    CERVICAL ROM:    Active ROM A/PROM (deg) eval  Flexion 64, cap 34  Extension 50   Right lateral flexion 32  Left lateral flexion 30  Right rotation WFL  Left rotation WFL   (Blank rows = not tested)  LUMBAR ROM:    AROM eval  Flexion WNL  Extension WNL   Right lateral flexion WNL  Left lateral flexion WNL   Right rotation WNL  Left rotation WNL    (Blank rows = not tested)   LOWER EXTREMITY ROM:      Active  Right eval Left eval  Hip flexion   WNL  Hip extension      Hip abduction      Hip adduction      Hip internal rotation      Hip external rotation   Tight   Knee flexion   WNL  Knee  extension   WNL   Ankle dorsiflexion      Ankle plantarflexion      Ankle inversion      Ankle eversion       (Blank rows = not tested)   LOWER EXTREMITY MMT:     MMT Right eval Left eval  Hip flexion 5/5 5/5  Hip extension      Hip abduction      Hip adduction      Hip internal rotation      Hip external rotation      Knee flexion 5/5 5/5  Knee extension 5/5 5/5  Ankle dorsiflexion 5/5 5/5  Ankle plantarflexion      Ankle inversion      Ankle eversion       (Blank rows = not tested)     LUMBAR SPECIAL TESTS:  Prone instability test: Negative, Straight leg raise test: Negative, and Slump test: Negative Neg Spurling cervical spine    FUNCTIONAL TESTS:  NT on eval    GAIT: Distance walked: 150 Assistive device utilized: None Level of assistance: Complete Independence Comments: none   TODAY'S TREATMENT:  [facility] Adult PT Treatment:                                                DATE: 02/12/23 Therapeutic Exercise: Elliptical ramp 13, reduced to 1 , resistance 5 to 1 , 5 min total  90/90 isometric hold 30 sec Lower core: alt knee extension, dead bug , added UE pressing into opposite arm  Seated horizontal abduction in star pattern blue band  Squat 25 lbs x 15  Hinge 25 lbs x 15  Row, extension with Freemotion 3-4 plates x 15 each  ER 2 plates at neutral x 10 x 2 each side (Rt side weaker) Diagonal pull 2 plates x 10 L UE , 1 plate on Rt UE x 10 x 2 sets   [facility] Adult PT Treatment:                                                DATE: 02/05/23 Therapeutic Exercise: UBE 3 min each way Level 3 Cat camel to childs pose Thread the needle x 2 each  Supine horizontal abduction blue band  Supine alternating diagonals blue band  90/90 ab brace 10 sec , no pain, able to perform alternating toe taps x 10 without LBP Hip flexor stretch bilateral   LTR Figure 4 stretch bilateral Pec stretch in doorway   Therapeutic Activity: Lift 15# x 10 Squat 15# x 10    [facility] Adult PT Treatment:                                                DATE: 02/02/23 Therapeutic Exercise: UBE level 3 in reverse for 5 min  Corner Pec Major Stretch 2 x 30 sec  Cat Cow to Child's Pose x 5 Thoracic mobility (thread the needle)  Lower trunk rotation with head turns  Blue band x 10 for horizontal abd and diagonal pull supine then standing at wall  Row 2 sets x 10, 45 lbs  Lat pull down -next visit  Self Care: Neck position as it relates to lifting and avoiding jutting chin forward in order to gain ROM in shoulder, neutral spine   PATIENT EDUCATION:  Education details: HEP, mobility, core  Person educated: Patient Education method: Explanation and Demonstration Education comprehension: verbalized understanding, returned demonstration, and needs further education   HOME EXERCISE PROGRAM: Access Code: [email or web address] URL: [email or web address] Date: 01/18/2023 Prepared by: Raeford Razor     ASSESSMENT:   CLINICAL IMPRESSION: Patient has met his goals and is back to the gym exercising several times a week.  He complains of minimal occasional right shoulder pain but no longer has back or neck pain.  He can demonstrate lifting with no cues and understand modifications for exercises if needed.  He is agreeable to discharge today.  He met his FOTO goal at 88%.    OBJECTIVE IMPAIRMENTS: decreased mobility, difficulty walking, decreased ROM, decreased strength, increased fascial restrictions, impaired flexibility, improper body mechanics, postural dysfunction, pain, and   .    ACTIVITY LIMITATIONS: carrying, lifting, bending, squatting, and sleeping   PARTICIPATION LIMITATIONS: community activity  PERSONAL FACTORS: Time since onset of injury/illness/exacerbation are also affecting patient's functional outcome.    REHAB POTENTIAL: Excellent    CLINICAL DECISION MAKING: Stable/uncomplicated   EVALUATION COMPLEXITY: Low     GOALS:   LONG TERM GOALS: Target date: 03/01/2023     Patient will be independent with home exercise routine and lifting principles for the gym Baseline:  Goal status:MET    2.  Patient will be able to carry his daughter without increased lower back pain Baseline: is now able to sense his positioning  Goal status: MET    3.  Patient will report UE/cervical pain in AM reduced to < 2/10 no more than 2 days/week Baseline:better Goal status: MET   4.  Patient will be able to perform lower ab exercises in supine without increasing lumbar strain Baseline: pain with 90/90 toe taps  Goal status: MET    5.  Patient will show proper body mechanics with lifting, squatting, dead lifting to preserve spine integrity Baseline:  Goal status: MET    6.  FOTO score will improve to 87% or greater to demonstrate improved functional independence Baseline:  Goal status: MEt, 88%   PLAN:   PT FREQUENCY: 1x/week   PT DURATION: 6 weeks   PLANNED INTERVENTIONS: Therapeutic exercises, Therapeutic activity, Neuromuscular re-education, Balance training, Gait training, Patient/Family education, Self Care, Joint mobilization, Dry Needling, Cryotherapy, Moist heat, Manual therapy, and Re-evaluation.   PLAN FOR NEXT SESSION: Check home exercise program lower ab/back stabilization begin lifting and hinging   Raeford Razor, PT 02/12/23 10:28 AM Phone: [phone] Fax: [phone]      PHYSICAL THERAPY DISCHARGE SUMMARY  Visits from Start of Care: 4  Current functional level related to goals / functional outcomes: See above    Remaining deficits: NONE limiting function.  Min Rt shoulder pain .    Education / Equipment: Posure, lifting, HEP    Patient agrees to discharge. Patient goals were met. Patient is being discharged due to being pleased with the current functional level.   Raeford Razor, PT 02/12/23  10:31 AM Phone: [phone] Fax: [phone]

## 2023-02-12 ENCOUNTER — Ambulatory Visit: Payer: BC Managed Care – PPO | Admitting: Physical Therapy

## 2023-02-12 ENCOUNTER — Encounter: Payer: Self-pay | Admitting: Physical Therapy

## 2023-02-12 DIAGNOSIS — M5459 Other low back pain: Secondary | ICD-10-CM | POA: Diagnosis not present

## 2023-02-12 DIAGNOSIS — M5412 Radiculopathy, cervical region: Secondary | ICD-10-CM

## 2023-02-23 DIAGNOSIS — R079 Chest pain, unspecified: Secondary | ICD-10-CM | POA: Diagnosis not present

## 2023-03-20 ENCOUNTER — Encounter: Payer: Self-pay | Admitting: Family Medicine

## 2023-03-20 ENCOUNTER — Ambulatory Visit: Payer: BC Managed Care – PPO | Admitting: Family Medicine

## 2023-03-20 VITALS — BP 117/84 | HR 70 | Temp 98.0°F | Ht 71.0 in | Wt 196.0 lb

## 2023-03-20 DIAGNOSIS — R1032 Left lower quadrant pain: Secondary | ICD-10-CM

## 2023-03-20 NOTE — Assessment & Plan Note (Signed)
-   abdominal exam is nonspecific. We did review his CT scan and it was noted there are surgical clips in his left groin area. This could very well be the cause of pain that happens that is fleeting  - pt has had normal bowel movements, with no blood in stool - no weight loss

## 2023-03-20 NOTE — Progress Notes (Signed)
Established patient visit   Patient: Brett Hall   DOB: November 29, 1971   52 y.o. Male  MRN: [id number] Visit Date: 03/20/2023  Today's healthcare provider: Charlton Amor, DO   Chief Complaint  Patient presents with   Abdominal Pain    Lower left side; Started last year but are more frequent. Coming at least 4-5 times daily lasting 1-2 secs. Imaging done was normal on 01/24/2023, but pain still has not subsided.    SUBJECTIVE    Chief Complaint  Patient presents with   Abdominal Pain    Lower left side; Started last year but are more frequent. Coming at least 4-5 times daily lasting 1-2 secs. Imaging done was normal on 01/24/2023, but pain still has not subsided.   HPI  Pt presents with abdominal pain. He has a history of abdominal pain and saw PCP in February. He did have CT abdomen done that was normal. He has a sharp pain twice a day for a few seconds. When he presses down he notices it is relieved. He feels pain around groin area. No correlation with food for what he can notices. He claims to eat a healthy diet. He says he makes a smoothie in the morning time with protein, wheat grass and hemp seed.   Review of Systems  Constitutional:  Negative for activity change, fatigue and fever.  Respiratory:  Negative for cough and shortness of breath.   Cardiovascular:  Negative for chest pain.  Gastrointestinal:  Positive for abdominal pain.  Genitourinary:  Negative for difficulty urinating.       Current Meds  Medication Sig   Cholecalciferol (VITAMIN D-3) 25 MCG (1000 UT) CAPS Take by mouth.   magnesium 30 MG tablet Take 30 mg by mouth 2 (two) times daily.   UNABLE TO FIND Med Name: Whey Protein-No sugar   UNABLE TO FIND Med Name: Wheat grass   UNABLE TO FIND Med Name: Sea moss    OBJECTIVE    BP 117/84   Pulse 70   Temp 98 F (36.7 C) (Oral)   Ht  (1.803 m)   Wt 196 lb 0.6 oz (88.9 kg)   SpO2 100%   BMI 27.34 kg/m   Physical Exam Vitals and nursing  note reviewed.  Constitutional:      General: He is not in acute distress.    Appearance: Normal appearance.  HENT:     Head: Normocephalic and atraumatic.     Right Ear: External ear normal.     Left Ear: External ear normal.     Nose: Nose normal.  Eyes:     Conjunctiva/sclera: Conjunctivae normal.  Cardiovascular:     Rate and Rhythm: Normal rate and regular rhythm.  Pulmonary:     Effort: Pulmonary effort is normal.     Breath sounds: Normal breath sounds.  Abdominal:     Tenderness: There is abdominal tenderness in the left lower quadrant.  Neurological:     General: No focal deficit present.     Mental Status: He is alert and oriented to person, place, and time.  Psychiatric:        Mood and Affect: Mood normal.        Behavior: Behavior normal.        Thought Content: Thought content normal.        Judgment: Judgment normal.        ASSESSMENT & PLAN    Problem List Items Addressed This Visit  Other   Left lower quadrant abdominal pain - Primary    - abdominal exam is nonspecific. We did review his CT scan and it was noted there are surgical clips in his left groin area. This could very well be the cause of pain that happens that is fleeting  - pt has had normal bowel movements, with no blood in stool - no weight loss        Return if symptoms worsen or fail to improve.      No orders of the defined types were placed in this encounter.   No orders of the defined types were placed in this encounter.    Charlton Amor, DO  [facility] at [facility] [phone] (phone) [phone] (fax)  [facility]

## 2023-06-11 ENCOUNTER — Ambulatory Visit: Payer: BC Managed Care – PPO | Admitting: Medical-Surgical

## 2023-06-11 ENCOUNTER — Encounter: Payer: Self-pay | Admitting: Medical-Surgical

## 2023-06-11 VITALS — BP 106/62 | HR 67 | Resp 20 | Ht 71.0 in | Wt 201.0 lb

## 2023-06-11 DIAGNOSIS — R1032 Left lower quadrant pain: Secondary | ICD-10-CM | POA: Diagnosis not present

## 2023-06-11 NOTE — Progress Notes (Unsigned)
        Established patient visit  History, exam, impression, and plan:  1. Left lower quadrant abdominal pain Pleasant 52 year old male presenting today with continued concerns of LLQ abdominal pain. Was seen by his PCP in 01/2023 and by Dr. Tamera Punt in 03/2023 for the same concern. CT abd/pelvis negative for concerning findings. It was suggested that the surgical clips from the varicocele repair that was done in 2021 could be a contributing factor. Today, he reports continued sharp, stabbing pain in the LLQ area that is intermittent and short lived. No abnormal bowel function or urinary trouble. No additional symptoms have developed.  After discussion, feel that it is likely to be scar tissue/adhesions or cervical clips that are contributing to his discomfort.  We did review recommendations for massage of the area several times daily to help break up scar tissue and adhesions as well as doing some stretching to target the area.  He is significantly concerned and wants to make sure that there is nothing that were missing.  He would like to proceed with MRI of the abdomen/pelvis.  I will be happy to order this however insurance may not cover it.  He would like to give it a try.   Procedures performed this visit: None.  No follow-ups on file.  __________________________________ Thayer Ohm, DNP, APRN, FNP-BC Primary Care and Sports Medicine [facility]

## 2023-06-17 ENCOUNTER — Encounter: Payer: Self-pay | Admitting: Medical-Surgical

## 2023-07-03 ENCOUNTER — Ambulatory Visit (INDEPENDENT_AMBULATORY_CARE_PROVIDER_SITE_OTHER): Payer: BC Managed Care – PPO

## 2023-07-03 DIAGNOSIS — R1032 Left lower quadrant pain: Secondary | ICD-10-CM | POA: Diagnosis not present

## 2023-07-03 DIAGNOSIS — M47816 Spondylosis without myelopathy or radiculopathy, lumbar region: Secondary | ICD-10-CM | POA: Diagnosis not present

## 2023-07-03 MED ORDER — GADOBUTROL 1 MMOL/ML IV SOLN
9.0000 mL | Freq: Once | INTRAVENOUS | Status: AC | PRN
Start: 2023-07-03 — End: 2023-07-03
  Administered 2023-07-03: 9 mL via INTRAVENOUS

## 2023-09-05 ENCOUNTER — Ambulatory Visit (INDEPENDENT_AMBULATORY_CARE_PROVIDER_SITE_OTHER): Payer: BC Managed Care – PPO | Admitting: Family Medicine

## 2023-09-05 ENCOUNTER — Encounter: Payer: Self-pay | Admitting: Family Medicine

## 2023-09-05 VITALS — BP 124/59 | HR 79 | Ht 71.0 in | Wt 198.8 lb

## 2023-09-05 DIAGNOSIS — L819 Disorder of pigmentation, unspecified: Secondary | ICD-10-CM | POA: Diagnosis not present

## 2023-09-05 DIAGNOSIS — G43009 Migraine without aura, not intractable, without status migrainosus: Secondary | ICD-10-CM | POA: Diagnosis not present

## 2023-09-05 NOTE — Assessment & Plan Note (Signed)
At this point he is getting 1 migraine about every 2 months.  We did discuss making sure that he takes his medication as soon as he feels the headache come on so that it is most effective and try to abort the headache.  It sounds like he also has not had a family history his mom has a history of migraines.  I do not think he needs any additional imaging at this point.  The over-the-counter's do seem to work well when he takes them but he does feel tired and sleepy during that time.  If he is in a place where he could take a nap and sleep it can be very helpful for migraine.

## 2023-09-05 NOTE — Progress Notes (Signed)
Established Patient Office Visit  Subjective   Patient ID: Brett Hall, male    DOB: Jun 15, 1971  Age: 52 y.o. MRN: [id number]  Chief Complaint  Patient presents with   blood in salva    Patient states was working out  an noticed some blood   very light  lasted about 5 minutes and concerned him   Headache    Patietn c/o  headaches every 1 to 2 months - last 2 to 3 days causes sleepiness     HPI  Was working out gym.  He was lifting weights but nothing heavy.  And tasted blood in his mouth.  He says it only happened once.  He denies any injury or trauma to the mouth.  He says he was not sick at the time.  HAs every 1-2 months for about 1-2 days.  Last headache that he had lasted a most 3 days.  He does usually take an over-the-counter medication such as Excedrin and that does seem to help.  But during the time of the headache he just feels groggy and sleepy and yawns a lot.  He has had headaches for several years now.  He had a brain MRI done in 2021 for headaches that was essentially normal.  He wanted to know if he should get any additional brain imaging.  He is also noticed some pigmentation changes on his lips since he has been wearing braces he has had them on for about 17 months at this point and will likely have them on for another 3 to 4 months.  He wants to know what he could do about the pigmentation change.    ROS    Objective:     BP (!) 124/59   Pulse 79   Ht 5\' 11"  (1.803 m)   Wt 198 lb 12 oz (90.2 kg)   SpO2 98%   BMI 27.72 kg/m    Physical Exam Vitals and nursing note reviewed.  Constitutional:      Appearance: Normal appearance.  HENT:     Head: Normocephalic and atraumatic.  Eyes:     Conjunctiva/sclera: Conjunctivae normal.  Cardiovascular:     Rate and Rhythm: Normal rate and regular rhythm.  Pulmonary:     Effort: Pulmonary effort is normal.     Breath sounds: Normal breath sounds.  Skin:    General: Skin is warm and dry.  Neurological:      Mental Status: He is alert.  Psychiatric:        Mood and Affect: Mood normal.      No results found for any visits on 09/05/23.    The 10-year ASCVD risk score (Arnett DK, et al., 2019) is: 4.7%    Assessment & Plan:   Problem List Items Addressed This Visit       Cardiovascular and Mediastinum   Migraine headache - Primary    At this point he is getting 1 migraine about every 2 months.  We did discuss making sure that he takes his medication as soon as he feels the headache come on so that it is most effective and try to abort the headache.  It sounds like he also has not had a family history his mom has a history of migraines.  I do not think he needs any additional imaging at this point.  The over-the-counter's do seem to work well when he takes them but he does feel tired and sleepy during that time.  If he is in  a place where he could take a nap and sleep it can be very helpful for migraine.      Other Visit Diagnoses     Disorder of pigmentation of lip       Relevant Orders   Ambulatory referral to Dermatology      Pigmentation of lip likely from friction from braces. Will refer to Dermatology.    Blood taste in mouth - likely from sinuses.  Self limited. No other concerning sxs.     No follow-ups on file.   I spent 25 minutes on the day of the encounter to include pre-visit record review, face-to-face time with the patient and post visit ordering of test.  Nani Gasser, MD

## 2023-10-09 ENCOUNTER — Encounter: Payer: BC Managed Care – PPO | Admitting: Family Medicine

## 2023-11-20 ENCOUNTER — Encounter: Payer: Self-pay | Admitting: Family Medicine

## 2023-11-20 ENCOUNTER — Ambulatory Visit (INDEPENDENT_AMBULATORY_CARE_PROVIDER_SITE_OTHER): Payer: BC Managed Care – PPO | Admitting: Family Medicine

## 2023-11-20 VITALS — BP 130/70 | HR 71 | Ht 71.0 in | Wt 200.0 lb

## 2023-11-20 DIAGNOSIS — Z125 Encounter for screening for malignant neoplasm of prostate: Secondary | ICD-10-CM

## 2023-11-20 DIAGNOSIS — Z0000 Encounter for general adult medical examination without abnormal findings: Secondary | ICD-10-CM

## 2023-11-20 DIAGNOSIS — R7301 Impaired fasting glucose: Secondary | ICD-10-CM

## 2023-11-20 NOTE — Progress Notes (Signed)
Complete physical exam  Patient: Brett Hall   DOB: May 05, 1971   52 y.o. Male  MRN: [id number]  Subjective:    Chief Complaint  Patient presents with   Annual Exam    Zyen Acker is a 52 y.o. male who presents today for a complete physical exam. He reports consuming a general diet.  He exercises regularly.  He generally feels well. He reports sleeping fairly well. He does not have additional problems to discuss today.    Most recent fall risk assessment:    09/05/2023   10:52 AM  Fall Risk   Falls in the past year? 0  Number falls in past yr: 0  Injury with Fall? 0  Risk for fall due to : No Fall Risks  Follow up Falls evaluation completed     Most recent depression screenings:    11/20/2023   11:52 AM 09/05/2023   10:53 AM  PHQ 2/9 Scores  PHQ - 2 Score 0 0         Patient Care Team: Agapito Games, MD as PCP - General (Family Medicine)   Outpatient Medications Prior to Visit  Medication Sig   Cholecalciferol (VITAMIN D-3) 25 MCG (1000 UT) CAPS Take by mouth.   magnesium 30 MG tablet Take 30 mg by mouth 2 (two) times daily.   UNABLE TO FIND Med Name: Whey Protein-No sugar   UNABLE TO FIND Med Name: Wheat grass   UNABLE TO FIND Med Name: Sea moss   No facility-administered medications prior to visit.    ROS        Objective:     BP 130/70   Pulse 71   Ht 5\' 11"  (1.803 m)   Wt 200 lb (90.7 kg)   SpO2 100%   BMI 27.89 kg/m     Physical Exam Constitutional:      Appearance: Normal appearance.  HENT:     Head: Normocephalic and atraumatic.     Right Ear: Tympanic membrane, ear canal and external ear normal.     Left Ear: Tympanic membrane, ear canal and external ear normal.     Nose: Nose normal.     Mouth/Throat:     Pharynx: Oropharynx is clear.  Eyes:     Extraocular Movements: Extraocular movements intact.     Conjunctiva/sclera: Conjunctivae normal.     Pupils: Pupils are equal, round, and reactive to light.  Neck:      Thyroid: No thyromegaly.  Cardiovascular:     Rate and Rhythm: Normal rate and regular rhythm.  Pulmonary:     Effort: Pulmonary effort is normal.     Breath sounds: Normal breath sounds.  Abdominal:     General: Bowel sounds are normal.     Palpations: Abdomen is soft.     Tenderness: There is no abdominal tenderness.  Musculoskeletal:        General: No swelling.     Cervical back: Neck supple.  Skin:    General: Skin is warm and dry.  Neurological:     Mental Status: He is oriented to person, place, and time.  Psychiatric:        Mood and Affect: Mood normal.        Behavior: Behavior normal.      No results found for any visits on 11/20/23.      Assessment & Plan:    Routine Health Maintenance and Physical Exam  Immunization History  Administered Date(s) Administered   Hepatitis A 06/14/2009  Hepatitis B 06/14/2009   Influenza,inj,Quad PF,6+ Mos 08/23/2017   PPD Test 07/10/2016   Td 06/14/2009   Tdap 04/20/2020    Health Maintenance  Topic Date Due   Hepatitis C Screening  Never done   COVID-19 Vaccine (1 - 2024-25 season) Never done   Zoster Vaccines- Shingrix (1 of 2) 12/06/2023 (Originally 07/21/2023)   INFLUENZA VACCINE  03/03/2024 (Originally 07/05/2023)   DTaP/Tdap/Td (3 - Td or Tdap) 04/20/2030   Colonoscopy  10/25/2031   HIV Screening  Completed   HPV VACCINES  Aged Out    Discussed health benefits of physical activity, and encouraged him to engage in regular exercise appropriate for his age and condition.  Problem List Items Addressed This Visit       Endocrine   IFG (impaired fasting glucose)   Relevant Orders   Hemoglobin A1c   Other Visit Diagnoses       Wellness examination    -  Primary   Relevant Orders   CMP14+EGFR   Lipid panel   CBC   Hemoglobin A1c   PSA     Screening for malignant neoplasm of prostate       Relevant Orders   CMP14+EGFR   Lipid panel   CBC   Hemoglobin A1c   PSA       Keep up a regular  exercise program and make sure you are eating a healthy diet Try to eat 4 servings of dairy a day, or if you are lactose intolerant take a calcium with vitamin D daily.  Your vaccines are up to date.  Encouraged him to schedule an eye exam he has noticed that is a little harder to read fine print up close.  Return in about 1 year (around 11/19/2024) for Wellness Exam.     Nani Gasser, MD

## 2023-11-21 LAB — CMP14+EGFR
ALT: 24 [IU]/L (ref 0–44)
AST: 32 [IU]/L (ref 0–40)
Albumin: 4.4 g/dL (ref 4.1–5.1)
Alkaline Phosphatase: 52 [IU]/L (ref 44–121)
BUN/Creatinine Ratio: 11 (ref 9–20)
BUN: 14 mg/dL (ref 6–24)
Bilirubin Total: 0.3 mg/dL (ref 0.0–1.2)
CO2: 26 mmol/L (ref 20–29)
Calcium: 9.9 mg/dL (ref 8.7–10.2)
Chloride: 103 mmol/L (ref 96–106)
Creatinine, Ser: 1.22 mg/dL (ref 0.76–1.27)
Globulin, Total: 2.4 g/dL (ref 1.5–4.5)
Glucose: 97 mg/dL (ref 70–99)
Potassium: 4.7 mmol/L (ref 3.5–5.2)
Sodium: 142 mmol/L (ref 134–144)
Total Protein: 6.8 g/dL (ref 6.0–8.5)
eGFR: 72 mL/min/{1.73_m2} (ref >59)

## 2023-11-21 LAB — CBC
Hematocrit: 42.1 % (ref 37.5–51.0)
Hemoglobin: 13.7 g/dL (ref 13.0–17.7)
MCH: 29.9 pg (ref 26.6–33.0)
MCHC: 32.5 g/dL (ref 31.5–35.7)
MCV: 92 fL (ref 79–97)
Platelets: 160 10*3/uL (ref 150–450)
RBC: 4.58 x10E6/uL (ref 4.14–5.80)
RDW: 12.9 % (ref 11.6–15.4)
WBC: 3.8 10*3/uL (ref 3.4–10.8)

## 2023-11-21 LAB — PSA: Prostate Specific Ag, Serum: 1.1 ng/mL (ref 0.0–4.0)

## 2023-11-21 LAB — LIPID PANEL
Chol/HDL Ratio: 3.5 {ratio} (ref 0.0–5.0)
Cholesterol, Total: 204 mg/dL — ABNORMAL HIGH (ref 100–199)
HDL: 59 mg/dL (ref >39)
LDL Chol Calc (NIH): 121 mg/dL — ABNORMAL HIGH (ref 0–99)
Triglycerides: 134 mg/dL (ref 0–149)
VLDL Cholesterol Cal: 24 mg/dL (ref 5–40)

## 2023-11-21 LAB — HEMOGLOBIN A1C
Est. average glucose Bld gHb Est-mCnc: 131 mg/dL
Hgb A1c MFr Bld: 6.2 % — ABNORMAL HIGH (ref 4.8–5.6)

## 2023-11-21 NOTE — Progress Notes (Signed)
Hi Brett Hall LDL cholesterol is a little elevated.  Continue to work on healthy diet and regular exercise.  A1c is 6.2 in the prediabetes range it did go up compared to last year make sure you are cutting back on sweets and portions of carbohydrates.  I want to keep you from moving towards full-blown diabetes.  Lets plan to recheck that level again in 6 months.  Blood count and metabolic panel and prostate test are normal.

## 2024-05-14 DIAGNOSIS — R079 Chest pain, unspecified: Secondary | ICD-10-CM | POA: Diagnosis not present

## 2024-05-14 DIAGNOSIS — R202 Paresthesia of skin: Secondary | ICD-10-CM | POA: Diagnosis not present

## 2024-08-05 ENCOUNTER — Encounter: Payer: Self-pay | Admitting: Sports Medicine

## 2024-10-27 ENCOUNTER — Ambulatory Visit (HOSPITAL_COMMUNITY)
Admission: EM | Admit: 2024-10-27 | Discharge: 2024-10-27 | Disposition: A | Discharge status: Home / Self Care | Attending: Emergency Medicine | Admitting: Emergency Medicine

## 2024-10-27 ENCOUNTER — Encounter (HOSPITAL_COMMUNITY): Payer: Self-pay | Admitting: *Deleted

## 2024-10-27 DIAGNOSIS — R142 Eructation: Secondary | ICD-10-CM

## 2024-10-27 DIAGNOSIS — K219 Gastro-esophageal reflux disease without esophagitis: Secondary | ICD-10-CM | POA: Diagnosis not present

## 2024-10-27 DIAGNOSIS — R109 Unspecified abdominal pain: Secondary | ICD-10-CM | POA: Diagnosis not present

## 2024-10-27 MED ORDER — PANTOPRAZOLE SODIUM 40 MG PO TBEC: 40.0000 mg | DELAYED_RELEASE_TABLET | Freq: Every day | ORAL | 0 refills | Status: AC

## 2024-10-27 NOTE — ED Triage Notes (Signed)
 Pt states he had some Thalia about a month ago and since he has mid abdominal discomfort on and off. States that he knows his body and something is off. He is belching a lot and passing a lot. He is not taking any meds for sx.

## 2024-10-27 NOTE — ED Provider Notes (Signed)
 [facility]    CSN: [id number] Arrival date & time: 10/27/24  1756      History   Chief Complaint Chief Complaint  Patient presents with   Abdominal Pain   Bloated    HPI Brett Hall is a 53 y.o. male.  About a month ago he started having some reflux/regurgitation symptoms Initially started after eating greasy pizza Self treated at home with supplements   However since then he's been having some abdominal discomfort, fullness, and increased belching. No regurgitation this time. No nausea or vomiting Denies actual pain, more of discomfort.  No fevers Normal BMs, no pain, no blood  No interventions yet   He does not smoke, rarely drinks alcohol. Does not take NSAIDs.   Past Medical History:  Diagnosis Date   Chest pain, atypical    Femoral hernia    GERD (gastroesophageal reflux disease)    RBBB (right bundle branch block with left anterior fascicular block)    hemblock    Patient Active Problem List   Diagnosis Date Noted   Radiculitis of right cervical region 12/26/2022   Lumbar degenerative disc disease 12/26/2022   Hyperlipidemia LDL goal <70 07/13/2022   Palpitations 07/13/2022   Left lower quadrant abdominal pain 05/04/2021   Left flank pain 05/04/2021   Other microscopic hematuria 05/04/2021   Constipation 05/04/2021   Migraine headache 04/20/2020   Varicocele 04/05/2020   Pelvic pain in male 10/29/2019   Elevated CK 08/15/2019   Solitary lung nodule 08/15/2019   IFG (impaired fasting glucose) 05/14/2018   ED (erectile dysfunction) 08/05/2015   Enlarged prostate 04/10/2014   Low back pain 11/22/2012   Gastroesophageal reflux disease 02/22/2011   BUNDLE BRANCH BLOCK, RIGHT HEMIBLOCK 02/06/2011    Past Surgical History:  Procedure Laterality Date   VARICOCELE EXCISION  2021       Home Medications    Prior to Admission medications   Medication Sig Start Date End Date Taking? Authorizing Provider  Cholecalciferol (VITAMIN  D-3) 25 MCG (1000 UT) CAPS Take by mouth.   Yes [provider]  magnesium 30 MG tablet Take 30 mg by mouth 2 (two) times daily.   Yes [provider]  pantoprazole  (PROTONIX ) 40 MG tablet Take 1 tablet (40 mg total) by mouth daily for 14 days. 10/27/24 11/10/24 Yes Ayssa Bentivegna, Asberry, PA-C  UNABLE TO FIND Med Name: Whey Protein-No sugar   Yes [provider]  UNABLE TO FIND Med Name: Wheat grass   Yes [provider]  UNABLE TO FIND Med Name: Sea moss   Yes [provider]    Family History Family History  Problem Relation Age of Onset   Coronary artery disease Other        No family history of premature CAD   Diabetes Other    Colon cancer Neg Hx    Esophageal cancer Neg Hx    Stomach cancer Neg Hx     Social History Social History   Tobacco Use   Smoking status: Never   Smokeless tobacco: Never  Vaping Use   Vaping status: Never Used  Substance Use Topics   Alcohol use: Yes    Comment: 1-2 times a month   Drug use: Yes    Types: Marijuana    Comment: 1-2 times per month per pt     Allergies   Citric acid   Review of Systems Review of Systems As per HPI  Physical Exam Triage Vital Signs ED Triage Vitals  Encounter  Vitals Group     BP 10/27/24 1950 (!) 140/86     Girls Systolic BP Percentile --      Girls Diastolic BP Percentile --      Boys Systolic BP Percentile --      Boys Diastolic BP Percentile --      Pulse --      Resp 10/27/24 1950 14     Temp 10/27/24 1950 97.6 F (36.4 C)     Temp Source 10/27/24 1950 Oral     SpO2 10/27/24 1950 97 %     Weight --      Height --      Head Circumference --      Peak Flow --      Pain Score 10/27/24 1948 0     Pain Loc --      Pain Education --      Exclude from Growth Chart --    No data found.  Updated Vital Signs BP (!) 140/86 (BP Location: Left Arm)   Temp 97.6 F (36.4 C) (Oral)   Resp 14   SpO2 97%   Visual Acuity Right Eye Distance:   Left Eye  Distance:   Bilateral Distance:    Right Eye Near:   Left Eye Near:    Bilateral Near:     Physical Exam Vitals and nursing note reviewed.  Constitutional:      Appearance: Normal appearance. He is not ill-appearing or diaphoretic.  HENT:     Mouth/Throat:     Mouth: Mucous membranes are moist.     Pharynx: Oropharynx is clear.  Eyes:     Conjunctiva/sclera: Conjunctivae normal.  Cardiovascular:     Rate and Rhythm: Normal rate and regular rhythm.     Heart sounds: Normal heart sounds.  Pulmonary:     Effort: Pulmonary effort is normal. No respiratory distress.     Breath sounds: Normal breath sounds.  Abdominal:     General: Bowel sounds are normal.     Palpations: Abdomen is soft.     Tenderness: There is no abdominal tenderness. There is no right CVA tenderness, left CVA tenderness, guarding or rebound.  Musculoskeletal:        General: Normal range of motion.  Skin:    General: Skin is warm and dry.  Neurological:     Mental Status: He is alert and oriented to person, place, and time.      UC Treatments / Results  Labs (all labs ordered are listed, but only abnormal results are displayed) Labs Reviewed - No data to display  EKG   Radiology No results found.  Procedures Procedures (including critical care time)  Medications Ordered in UC Medications - No data to display  Initial Impression / Assessment and Plan / UC Course  I have reviewed the triage vital signs and the nursing notes.  Pertinent labs & imaging results that were available during my care of the patient were reviewed by me and considered in my medical decision making (see chart for details).  Afebrile, stable vitals, well appearing, non tender abdomen Recommend treat for reflux/gastritis with protonix  daily x 14 days. Can also use pepcid 1-2 times daily prn. Bland diet and foods to avoid discussed. He has a primary care visit in about a month, will follow up then regarding symptoms. Return  and ED precautions in the meantime. Patient agrees to plan, questions answered   Final Clinical Impressions(s) / UC Diagnoses   Final diagnoses:  Abdominal  discomfort  Gastroesophageal reflux disease without esophagitis  Belching     Discharge Instructions      Protonix  (pantoprazole ) once every morning for the next 14 days. It can take 1-3 days to start working. Take first thing in the morning on an empty stomach.  Remain upright for 30 minutes after taking medicine.  Please take the full 14-day course.  You can use famotidine (Pepcid) at home as needed.  Increase fluids as much as tolerated.  Bland diet for the next 2 weeks.  Avoid fatty, greasy, citrusy, spicy, caffeine and alcohol as these can worsen symptoms.  Do not use any ibuprofen/Advil or other NSAIDs as these can worsen symptoms.  If at any point your symptoms worsen, including severe pain or inability to tolerate fluids, please go directly to the emergency department.    ED Prescriptions     Medication Sig Dispense Auth. Provider   pantoprazole  (PROTONIX ) 40 MG tablet Take 1 tablet (40 mg total) by mouth daily for 14 days. 14 tablet Ankit Degregorio, Asberry, PA-C      PDMP not reviewed this encounter.   Darolyn Double, PA-C 10/27/24 2106

## 2024-10-27 NOTE — Discharge Instructions (Addendum)
 Protonix  (pantoprazole ) once every morning for the next 14 days. It can take 1-3 days to start working. Take first thing in the morning on an empty stomach.  Remain upright for 30 minutes after taking medicine.  Please take the full 14-day course.  You can use famotidine (Pepcid) at home as needed.  Increase fluids as much as tolerated.  Bland diet for the next 2 weeks.  Avoid fatty, greasy, citrusy, spicy, caffeine and alcohol as these can worsen symptoms.  Do not use any ibuprofen/Advil or other NSAIDs as these can worsen symptoms.  If at any point your symptoms worsen, including severe pain or inability to tolerate fluids, please go directly to the emergency department.

## 2024-11-20 ENCOUNTER — Encounter: Admitting: Family Medicine

## 2024-12-02 ENCOUNTER — Encounter: Payer: Self-pay | Admitting: Family Medicine

## 2024-12-02 ENCOUNTER — Encounter: Admitting: Family Medicine

## 2024-12-02 VITALS — BP 131/45 | HR 59 | Ht 71.0 in | Wt 198.1 lb

## 2024-12-02 DIAGNOSIS — E785 Hyperlipidemia, unspecified: Secondary | ICD-10-CM | POA: Diagnosis not present

## 2024-12-02 DIAGNOSIS — Z125 Encounter for screening for malignant neoplasm of prostate: Secondary | ICD-10-CM

## 2024-12-02 DIAGNOSIS — Z1159 Encounter for screening for other viral diseases: Secondary | ICD-10-CM | POA: Diagnosis not present

## 2024-12-02 DIAGNOSIS — Z0000 Encounter for general adult medical examination without abnormal findings: Secondary | ICD-10-CM | POA: Diagnosis not present

## 2024-12-02 NOTE — Patient Instructions (Signed)
 Consider getting the pneumonia vaccine ( Prevnar 20) and the Shingles vaccines.

## 2024-12-02 NOTE — Progress Notes (Signed)
 "  Complete physical exam  Patient: Brett Hall    DOB: [birth year] 53 y.o.   MRN: [id number]  Chief Complaint  Patient presents with   Annual Exam    Subjective:    Brett Hall is a 53 y.o. male who presents today for a complete physical exam. He reports consuming a general diet. Exercises regularly. He generally feels well. SABRA He does not have additional problems to discuss today.   Discussed the use of AI scribe software for clinical note transcription with the patient, who gave verbal consent to proceed.  History of Present Illness Brett Hall is a 53 year old male who presents with concerns about cardiovascular health and lower back pain.  Cardiovascular risk assessment - Concerned about potential plaque buildup around the heart despite being asymptomatic - No chest pain, shortness of breath, or palpitations - No family history of heart disease - No current tobacco use; quit smoking cigarettes and marijuana approximately two years ago - Maintains a healthy lifestyle with a balanced diet and regular exercise  Lower back pain - Persistent ache localized to the right lower back - No recent injuries or significant exacerbating events - Avoids exercises that strain the lower back, such as squats - Uses a machine that supports the back during exercise  Gastrointestinal symptoms - History of stomach inflammation treated successfully with medication at an urgent care visit - No current gastrointestinal symptoms - Regular bowel movements   Most recent fall risk assessment:    12/02/2024   11:21 AM  Fall Risk   Falls in the past year? 0  Number falls in past yr: 0  Injury with Fall? 0  Risk for fall due to : No Fall Risks  Follow up Falls evaluation completed     Most recent depression screenings:    12/02/2024   11:21 AM 11/20/2023   11:52 AM  PHQ 2/9 Scores  PHQ - 2 Score 0 0        Patient Care Team: Alvan Dorothyann BIRCH, MD as PCP - General  (Family Medicine)   ROS    Objective:    BP (!) 131/45   Pulse (!) 59   Ht 5' 11 (1.803 m)   Wt 198 lb 1.9 oz (89.9 kg)   SpO2 99%   BMI 27.63 kg/m     Physical Exam Constitutional:      Appearance: Normal appearance.  HENT:     Head: Normocephalic and atraumatic.     Right Ear: Tympanic membrane, ear canal and external ear normal.     Left Ear: Tympanic membrane, ear canal and external ear normal.     Nose: Nose normal.     Mouth/Throat:     Pharynx: Oropharynx is clear.  Eyes:     Extraocular Movements: Extraocular movements intact.     Conjunctiva/sclera: Conjunctivae normal.     Pupils: Pupils are equal, round, and reactive to light.  Neck:     Thyroid : No thyromegaly.  Cardiovascular:     Rate and Rhythm: Normal rate and regular rhythm.  Pulmonary:     Effort: Pulmonary effort is normal.     Breath sounds: Normal breath sounds.  Abdominal:     General: Bowel sounds are normal.     Palpations: Abdomen is soft.     Tenderness: There is no abdominal tenderness.  Musculoskeletal:        General: No swelling.     Cervical back: Neck supple.  Skin:    General: Skin  is warm and dry.  Neurological:     Mental Status: He is oriented to person, place, and time.  Psychiatric:        Mood and Affect: Mood normal.        Behavior: Behavior normal.       No results found for any visits on 12/02/24.      Assessment & Plan:    Routine Health Maintenance and Physical Exam Immunization History  Administered Date(s) Administered   Hepatitis A 06/14/2009   Hepatitis B 06/14/2009   Influenza,inj,Quad PF,6+ Mos 08/23/2017   PPD Test 07/10/2016   Td 06/14/2009   Tdap 04/20/2020    Health Maintenance  Topic Date Due   Hepatitis C Screening  Never done   Hepatitis B Vaccines 19-59 Average Risk (2 of 3 - 19+ 3-dose series) 07/12/2009   Pneumococcal Vaccine: 50+ Years (1 of 1 - PCV) Never done   Zoster Vaccines- Shingrix (1 of 2) Never done   COVID-19 Vaccine  (1 - 2025-26 season) Never done   Influenza Vaccine  03/03/2025 (Originally 07/04/2024)   DTaP/Tdap/Td (3 - Td or Tdap) 04/20/2030   Colonoscopy  10/25/2031   HIV Screening  Completed   HPV VACCINES  Aged Out   Meningococcal B Vaccine  Aged Out    Discussed health benefits of physical activity, and encouraged him to engage in regular exercise appropriate for his age and condition.  Problem List Items Addressed This Visit       Other   Hyperlipidemia LDL goal <70   Relevant Orders   CT CARDIAC SCORING (SELF PAY ONLY)   Other Visit Diagnoses       Routine general medical examination at a health care facility    -  Primary   Relevant Orders   PSA   HgB A1c   CBC   Lipid Panel With LDL/HDL Ratio   CMP14+EGFR   Hepatitis C Antibody     Encounter for hepatitis C screening test for low risk patient       Relevant Orders   PSA   HgB A1c   CBC   Lipid Panel With LDL/HDL Ratio   CMP14+EGFR   Hepatitis C Antibody     Screening for malignant neoplasm of prostate       Relevant Orders   PSA   HgB A1c   CBC   Lipid Panel With LDL/HDL Ratio   CMP14+EGFR   Hepatitis C Antibody       Assessment and Plan Assessment & Plan Adult Wellness Visit Routine wellness visit with no new symptoms. Discussed cardiovascular risk factors, screening options, and vaccines. Explained cardiac calcium  CT score test, PSA, and cholesterol panel. - Scheduled cardiac calcium  CT score test in [place]. - Ordered blood work: metabolic panel, blood count, PSA, cholesterol panel. - Discussed pneumonia vaccine (Prevnar 20) and shingles vaccine options. - Ensured colonoscopy is up to date.  Low back pain Chronic low back pain, likely musculoskeletal. No recent injury or significant changes. Previous imaging showed no significant findings. - Continue current management as no significant changes in symptoms.    No follow-ups on file.    Dorothyann Byars, MD [facility]  Medicine at [facility]    "

## 2024-12-03 ENCOUNTER — Ambulatory Visit: Payer: Self-pay | Admitting: Family Medicine

## 2024-12-03 LAB — CMP14+EGFR
ALT: 20 IU/L (ref 0–44)
AST: 29 IU/L (ref 0–40)
Albumin: 4.4 g/dL (ref 3.8–4.9)
Alkaline Phosphatase: 57 IU/L (ref 47–123)
BUN/Creatinine Ratio: 12 (ref 9–20)
BUN: 14 mg/dL (ref 6–24)
Bilirubin Total: 0.3 mg/dL (ref 0.0–1.2)
CO2: 25 mmol/L (ref 20–29)
Calcium: 9.4 mg/dL (ref 8.7–10.2)
Chloride: 99 mmol/L (ref 96–106)
Creatinine, Ser: 1.21 mg/dL (ref 0.76–1.27)
Globulin, Total: 2.8 g/dL (ref 1.5–4.5)
Glucose: 97 mg/dL (ref 70–99)
Potassium: 4.5 mmol/L (ref 3.5–5.2)
Sodium: 138 mmol/L (ref 134–144)
Total Protein: 7.2 g/dL (ref 6.0–8.5)
eGFR: 72 mL/min/1.73

## 2024-12-03 LAB — CBC
Hematocrit: 42.1 % (ref 37.5–51.0)
Hemoglobin: 13.5 g/dL (ref 13.0–17.7)
MCH: 29.5 pg (ref 26.6–33.0)
MCHC: 32.1 g/dL (ref 31.5–35.7)
MCV: 92 fL (ref 79–97)
Platelets: 158 x10E3/uL (ref 150–450)
RBC: 4.58 x10E6/uL (ref 4.14–5.80)
RDW: 12.8 % (ref 11.6–15.4)
WBC: 3.4 x10E3/uL (ref 3.4–10.8)

## 2024-12-03 LAB — HEMOGLOBIN A1C
Est. average glucose Bld gHb Est-mCnc: 123 mg/dL
Hgb A1c MFr Bld: 5.9 % — ABNORMAL HIGH (ref 4.8–5.6)

## 2024-12-03 LAB — LIPID PANEL WITH LDL/HDL RATIO
Cholesterol, Total: 213 mg/dL — ABNORMAL HIGH (ref 100–199)
HDL: 57 mg/dL
LDL Chol Calc (NIH): 138 mg/dL — ABNORMAL HIGH (ref 0–99)
LDL/HDL Ratio: 2.4 ratio (ref 0.0–3.6)
Triglycerides: 99 mg/dL (ref 0–149)
VLDL Cholesterol Cal: 18 mg/dL (ref 5–40)

## 2024-12-03 LAB — PSA: Prostate Specific Ag, Serum: 0.8 ng/mL (ref 0.0–4.0)

## 2024-12-03 LAB — HEPATITIS C ANTIBODY: Hep C Virus Ab: NONREACTIVE

## 2024-12-03 NOTE — Progress Notes (Signed)
 Hi Markise, A1c is 5.9, in the prediabetes range.  It is a little better than it was last year so that is great I do want to still keep a close eye on this and recheck again in 6 months.  Your total cholesterol and LDL are elevated.  Prostate test and blood count are normal.  Negative for hepatitis C.  Kidney and liver function are stable.
